# Patient Record
Sex: Female | Born: 1940 | Race: Black or African American | Hispanic: No | Marital: Married | Smoking: Former smoker
Health system: Southern US, Community
[De-identification: ages and names within clinical notes are randomized; demographics above are authoritative.]

## PROBLEM LIST (undated history)

## (undated) DIAGNOSIS — K635 Polyp of colon: Secondary | ICD-10-CM

## (undated) DIAGNOSIS — I2510 Atherosclerotic heart disease of native coronary artery without angina pectoris: Secondary | ICD-10-CM

## (undated) DIAGNOSIS — I219 Acute myocardial infarction, unspecified: Secondary | ICD-10-CM

## (undated) DIAGNOSIS — K219 Gastro-esophageal reflux disease without esophagitis: Secondary | ICD-10-CM

## (undated) DIAGNOSIS — Z9981 Dependence on supplemental oxygen: Secondary | ICD-10-CM

## (undated) DIAGNOSIS — I739 Peripheral vascular disease, unspecified: Secondary | ICD-10-CM

## (undated) DIAGNOSIS — I509 Heart failure, unspecified: Secondary | ICD-10-CM

## (undated) DIAGNOSIS — I10 Essential (primary) hypertension: Secondary | ICD-10-CM

## (undated) DIAGNOSIS — C3490 Malignant neoplasm of unspecified part of unspecified bronchus or lung: Secondary | ICD-10-CM

## (undated) DIAGNOSIS — J449 Chronic obstructive pulmonary disease, unspecified: Secondary | ICD-10-CM

## (undated) HISTORY — PX: OTHER SURGICAL HISTORY: SHX169

## (undated) HISTORY — PX: LUNG LOBECTOMY: SHX167

## (undated) HISTORY — PX: TUBAL LIGATION: SHX77

## (undated) HISTORY — PX: CARDIAC CATHETERIZATION: SHX172

## (undated) HISTORY — PX: CHOLECYSTECTOMY: SHX55

## (undated) HISTORY — PX: TONSILLECTOMY: SUR1361

## (undated) HISTORY — PX: LOBECTOMY: SHX5089

---

## 2013-04-03 ENCOUNTER — Inpatient Hospital Stay (HOSPITAL_COMMUNITY)
Admission: EM | Admit: 2013-04-03 | Discharge: 2013-04-07 | DRG: 192 | Disposition: A | Discharge status: Home / Self Care | Payer: MEDICARE | Attending: Family Medicine | Admitting: Family Medicine

## 2013-04-03 ENCOUNTER — Emergency Department (HOSPITAL_COMMUNITY): Payer: MEDICARE

## 2013-04-03 ENCOUNTER — Encounter (HOSPITAL_COMMUNITY): Payer: Self-pay | Admitting: Emergency Medicine

## 2013-04-03 DIAGNOSIS — Z87891 Personal history of nicotine dependence: Secondary | ICD-10-CM

## 2013-04-03 DIAGNOSIS — D509 Iron deficiency anemia, unspecified: Secondary | ICD-10-CM | POA: Diagnosis present

## 2013-04-03 DIAGNOSIS — I25810 Atherosclerosis of coronary artery bypass graft(s) without angina pectoris: Secondary | ICD-10-CM | POA: Diagnosis present

## 2013-04-03 DIAGNOSIS — I498 Other specified cardiac arrhythmias: Secondary | ICD-10-CM | POA: Diagnosis present

## 2013-04-03 DIAGNOSIS — C3490 Malignant neoplasm of unspecified part of unspecified bronchus or lung: Secondary | ICD-10-CM

## 2013-04-03 DIAGNOSIS — J441 Chronic obstructive pulmonary disease with (acute) exacerbation: Secondary | ICD-10-CM | POA: Diagnosis present

## 2013-04-03 DIAGNOSIS — Z9861 Coronary angioplasty status: Secondary | ICD-10-CM

## 2013-04-03 DIAGNOSIS — I10 Essential (primary) hypertension: Secondary | ICD-10-CM | POA: Diagnosis present

## 2013-04-03 DIAGNOSIS — Z9981 Dependence on supplemental oxygen: Secondary | ICD-10-CM

## 2013-04-03 DIAGNOSIS — I509 Heart failure, unspecified: Secondary | ICD-10-CM | POA: Diagnosis present

## 2013-04-03 DIAGNOSIS — Z85118 Personal history of other malignant neoplasm of bronchus and lung: Secondary | ICD-10-CM

## 2013-04-03 DIAGNOSIS — R079 Chest pain, unspecified: Secondary | ICD-10-CM

## 2013-04-03 DIAGNOSIS — Z8673 Personal history of transient ischemic attack (TIA), and cerebral infarction without residual deficits: Secondary | ICD-10-CM

## 2013-04-03 HISTORY — DX: Malignant neoplasm of unspecified part of unspecified bronchus or lung: C34.90

## 2013-04-03 HISTORY — DX: Atherosclerotic heart disease of native coronary artery without angina pectoris: I25.10

## 2013-04-03 HISTORY — DX: Essential (primary) hypertension: I10

## 2013-04-03 HISTORY — DX: Heart failure, unspecified: I50.9

## 2013-04-03 HISTORY — DX: Gastro-esophageal reflux disease without esophagitis: K21.9

## 2013-04-03 HISTORY — DX: Chronic obstructive pulmonary disease, unspecified: J44.9

## 2013-04-03 LAB — COMPREHENSIVE METABOLIC PANEL
ALK PHOS: 81 U/L (ref 39–117)
ALT: 10 U/L (ref 0–35)
AST: 20 U/L (ref 0–37)
Albumin: 4.1 g/dL (ref 3.5–5.2)
BUN: 8 mg/dL (ref 6–23)
CHLORIDE: 98 meq/L (ref 96–112)
CO2: 31 mEq/L (ref 19–32)
Calcium: 9.2 mg/dL (ref 8.4–10.5)
Creatinine, Ser: 0.55 mg/dL (ref 0.50–1.10)
GFR calc Af Amer: >90 mL/min (ref >90)
GFR calc non Af Amer: >90 mL/min (ref >90)
GLUCOSE: 157 mg/dL — AB (ref 70–99)
POTASSIUM: 3.5 meq/L — AB (ref 3.7–5.3)
SODIUM: 139 meq/L (ref 137–147)
TOTAL PROTEIN: 7.4 g/dL (ref 6.0–8.3)
Total Bilirubin: 0.6 mg/dL (ref 0.3–1.2)

## 2013-04-03 LAB — CBC WITH DIFFERENTIAL/PLATELET
Basophils Absolute: 0 10*3/uL (ref 0.0–0.1)
Basophils Relative: 1 % (ref 0–1)
Eosinophils Absolute: 0.3 10*3/uL (ref 0.0–0.7)
Eosinophils Relative: 5 % (ref 0–5)
HCT: 27.6 % — ABNORMAL LOW (ref 36.0–46.0)
Hemoglobin: 8.6 g/dL — ABNORMAL LOW (ref 12.0–15.0)
LYMPHS ABS: 1.9 10*3/uL (ref 0.7–4.0)
LYMPHS PCT: 35 % (ref 12–46)
MCH: 25 pg — AB (ref 26.0–34.0)
MCHC: 31.2 g/dL (ref 30.0–36.0)
MCV: 80.2 fL (ref 78.0–100.0)
Monocytes Absolute: 0.5 10*3/uL (ref 0.1–1.0)
Monocytes Relative: 10 % (ref 3–12)
NEUTROS ABS: 2.7 10*3/uL (ref 1.7–7.7)
NEUTROS PCT: 50 % (ref 43–77)
PLATELETS: 354 10*3/uL (ref 150–400)
RBC: 3.44 MIL/uL — AB (ref 3.87–5.11)
RDW: 16.5 % — ABNORMAL HIGH (ref 11.5–15.5)
WBC: 5.5 10*3/uL (ref 4.0–10.5)

## 2013-04-03 LAB — URINALYSIS, ROUTINE W REFLEX MICROSCOPIC
Bilirubin Urine: NEGATIVE
GLUCOSE, UA: NEGATIVE mg/dL
Ketones, ur: NEGATIVE mg/dL
Leukocytes, UA: NEGATIVE
Nitrite: NEGATIVE
Protein, ur: NEGATIVE mg/dL
Specific Gravity, Urine: <1.005 — ABNORMAL LOW (ref 1.005–1.030)
Urobilinogen, UA: 0.2 mg/dL (ref 0.0–1.0)
pH: 6.5 (ref 5.0–8.0)

## 2013-04-03 LAB — PRO B NATRIURETIC PEPTIDE: Pro B Natriuretic peptide (BNP): 29.9 pg/mL (ref 0–125)

## 2013-04-03 LAB — URINE MICROSCOPIC-ADD ON

## 2013-04-03 LAB — TROPONIN I: Troponin I: <0.3 ng/mL (ref <0.30)

## 2013-04-03 MED ORDER — MORPHINE SULFATE 2 MG/ML IJ SOLN: 2.0000 mg | INTRAMUSCULAR | Status: DC | PRN

## 2013-04-03 MED ORDER — ALBUTEROL SULFATE (2.5 MG/3ML) 0.083% IN NEBU: 5.0000 mg | INHALATION_SOLUTION | Freq: Once | RESPIRATORY_TRACT | Status: DC

## 2013-04-03 MED ORDER — ALBUTEROL SULFATE (2.5 MG/3ML) 0.083% IN NEBU: 2.5000 mg | INHALATION_SOLUTION | RESPIRATORY_TRACT | Status: DC | PRN

## 2013-04-03 MED ORDER — ENOXAPARIN SODIUM 40 MG/0.4ML ~~LOC~~ SOLN
40.0000 mg | SUBCUTANEOUS | Status: DC
  Administered 2013-04-04 – 2013-04-06 (×3): 40 mg via SUBCUTANEOUS
  Filled 2013-04-03 (×3): qty 0.4

## 2013-04-03 MED ORDER — SODIUM CHLORIDE 0.9 % IV SOLN: 250.0000 mL | INTRAVENOUS | Status: DC | PRN

## 2013-04-03 MED ORDER — DM-GUAIFENESIN ER 30-600 MG PO TB12
1.0000 | ORAL_TABLET | Freq: Two times a day (BID) | ORAL | Status: DC
  Administered 2013-04-04 – 2013-04-07 (×8): 1 via ORAL
  Filled 2013-04-03 (×8): qty 1

## 2013-04-03 MED ORDER — METHYLPREDNISOLONE SODIUM SUCC 125 MG IJ SOLR
60.0000 mg | Freq: Four times a day (QID) | INTRAMUSCULAR | Status: DC
  Administered 2013-04-04 (×2): 60 mg via INTRAVENOUS
  Filled 2013-04-03 (×2): qty 2

## 2013-04-03 MED ORDER — ONDANSETRON HCL 4 MG/2ML IJ SOLN: 4.0000 mg | Freq: Four times a day (QID) | INTRAMUSCULAR | Status: DC | PRN

## 2013-04-03 MED ORDER — ATORVASTATIN CALCIUM 20 MG PO TABS
20.0000 mg | ORAL_TABLET | Freq: Every day | ORAL | Status: DC
  Administered 2013-04-04 – 2013-04-06 (×3): 20 mg via ORAL
  Filled 2013-04-03 (×3): qty 1

## 2013-04-03 MED ORDER — AMLODIPINE BESYLATE 5 MG PO TABS
5.0000 mg | ORAL_TABLET | Freq: Every day | ORAL | Status: DC
  Administered 2013-04-04 – 2013-04-07 (×4): 5 mg via ORAL
  Filled 2013-04-03 (×4): qty 1

## 2013-04-03 MED ORDER — IPRATROPIUM-ALBUTEROL 0.5-2.5 (3) MG/3ML IN SOLN
3.0000 mL | RESPIRATORY_TRACT | Status: DC
  Administered 2013-04-04 (×5): 3 mL via RESPIRATORY_TRACT
  Filled 2013-04-03 (×6): qty 3

## 2013-04-03 MED ORDER — ONDANSETRON HCL 4 MG PO TABS: 4.0000 mg | ORAL_TABLET | Freq: Four times a day (QID) | ORAL | Status: DC | PRN

## 2013-04-03 MED ORDER — ASPIRIN 81 MG PO CHEW
324.0000 mg | CHEWABLE_TABLET | Freq: Once | ORAL | Status: AC
  Administered 2013-04-03: 324 mg via ORAL
  Filled 2013-04-03: qty 4

## 2013-04-03 MED ORDER — BUDESONIDE-FORMOTEROL FUMARATE 160-4.5 MCG/ACT IN AERO
2.0000 | INHALATION_SPRAY | Freq: Two times a day (BID) | RESPIRATORY_TRACT | Status: DC
  Administered 2013-04-04 – 2013-04-07 (×8): 2 via RESPIRATORY_TRACT
  Filled 2013-04-03: qty 6

## 2013-04-03 MED ORDER — SODIUM CHLORIDE 0.9 % IJ SOLN: 3.0000 mL | INTRAMUSCULAR | Status: DC | PRN

## 2013-04-03 MED ORDER — ALBUTEROL SULFATE (2.5 MG/3ML) 0.083% IN NEBU
5.0000 mg | INHALATION_SOLUTION | Freq: Once | RESPIRATORY_TRACT | Status: AC
  Administered 2013-04-03: 5 mg via RESPIRATORY_TRACT
  Filled 2013-04-03: qty 6

## 2013-04-03 MED ORDER — IPRATROPIUM BROMIDE 0.02 % IN SOLN: 0.5000 mg | Freq: Once | RESPIRATORY_TRACT | Status: DC

## 2013-04-03 MED ORDER — IPRATROPIUM BROMIDE 0.02 % IN SOLN
0.5000 mg | Freq: Once | RESPIRATORY_TRACT | Status: AC
  Administered 2013-04-03: 0.5 mg via RESPIRATORY_TRACT
  Filled 2013-04-03: qty 2.5

## 2013-04-03 MED ORDER — SODIUM CHLORIDE 0.9 % IV SOLN: INTRAVENOUS | Status: DC

## 2013-04-03 MED ORDER — PREDNISONE 50 MG PO TABS
60.0000 mg | ORAL_TABLET | Freq: Once | ORAL | Status: AC
  Administered 2013-04-03: 60 mg via ORAL
  Filled 2013-04-03 (×2): qty 1

## 2013-04-03 MED ORDER — DIPHENHYDRAMINE HCL 25 MG PO CAPS
25.0000 mg | ORAL_CAPSULE | Freq: Once | ORAL | Status: AC
  Administered 2013-04-04: 25 mg via ORAL
  Filled 2013-04-03: qty 1

## 2013-04-03 MED ORDER — SODIUM CHLORIDE 0.9 % IJ SOLN
3.0000 mL | Freq: Two times a day (BID) | INTRAMUSCULAR | Status: DC
  Administered 2013-04-04 – 2013-04-06 (×7): 3 mL via INTRAVENOUS

## 2013-04-03 MED ORDER — ASPIRIN EC 81 MG PO TBEC
81.0000 mg | DELAYED_RELEASE_TABLET | Freq: Every day | ORAL | Status: DC
  Administered 2013-04-04 – 2013-04-07 (×4): 81 mg via ORAL
  Filled 2013-04-03 (×4): qty 1

## 2013-04-03 NOTE — ED Notes (Signed)
Pt reporting SOB and cough since last night.  Also reporting some "chest discomfort".  Denies nausea or vomiting.  No distress noted.

## 2013-04-03 NOTE — H&P (Signed)
PCP:   Normand Sloop, MD   Chief Complaint:  Shortness of breath  HPI: 73 year old female who   has a past medical history of CHF (congestive heart failure); COPD (chronic obstructive pulmonary disease); Coronary artery disease; Acid reflux; Hypertension; and Lung cancer. Today presented to the ED with chief complaint of shortness of breath which started yesterday. Patient has a history of COPD, lung cancer status post left upper lobe pneumonectomy, she also has been coughing up yellow-colored phlegm. Patient also complained of chest pain which started at home was 7/10 in intensity. Did not radiate, she has a history of CAD status post coronary stents in CHF. In the ED f first set  of cardiac enzymes are negative, chest x-ray showed underlying emphysema   Allergies:   Allergies  Allergen Reactions  . Ambien [Zolpidem]   . Penicillins       Past Medical History  Diagnosis Date  . CHF (congestive heart failure)   . COPD (chronic obstructive pulmonary disease)   . Coronary artery disease   . Acid reflux   . Hypertension   . Lung cancer     Past Surgical History  Procedure Laterality Date  . Tubal ligation    .  stent    . Cholecystectomy    . Tonsillectomy    . Lobectomy      Prior to Admission medications   Medication Sig Start Date End Date Taking? Authorizing Provider  albuterol (PROVENTIL) (2.5 MG/3ML) 0.083% nebulizer solution Take 2.5 mg by nebulization every 6 (six) hours as needed for wheezing or shortness of breath.   Yes Historical Provider, MD  amLODipine (NORVASC) 5 MG tablet Take 5 mg by mouth daily.   Yes Historical Provider, MD  aspirin EC 81 MG tablet Take 81 mg by mouth daily.   Yes Historical Provider, MD  budesonide-formoterol (SYMBICORT) 160-4.5 MCG/ACT inhaler Inhale 2 puffs into the lungs 2 (two) times daily.   Yes Historical Provider, MD  furosemide (LASIX) 20 MG tablet Take 20 mg by mouth as needed for fluid or edema.    Yes Historical Provider, MD   nitroGLYCERIN (NITROSTAT) 0.4 MG SL tablet Place 0.4 mg under the tongue every 5 (five) minutes as needed for chest pain.   Yes Historical Provider, MD  omeprazole (PRILOSEC OTC) 20 MG tablet Take 20 mg by mouth daily.   Yes Historical Provider, MD  rosuvastatin (CRESTOR) 10 MG tablet Take 10 mg by mouth daily.   Yes Historical Provider, MD    Social History:  reports that she has quit smoking. She does not have any smokeless tobacco history on file. She reports that she does not drink alcohol or use illicit drugs.  History reviewed. No pertinent family history.   All the positives are listed in BOLD  Review of Systems:  HEENT: Headache, blurred vision, runny nose, sore throat Neck: Hypothyroidism, hyperthyroidism,,lymphadenopathy Chest : Shortness of breath, history of COPD, Asthma Heart : Chest pain, history of coronary arterey disease GI:  Nausea, vomiting, diarrhea, constipation, GERD GU: Dysuria, urgency, frequency of urination, hematuria Neuro: Stroke, seizures, syncope Psych: Depression, anxiety, hallucinations   Physical Exam: Blood pressure 127/59, pulse 108, resp. rate 29, SpO2 99.00%. Constitutional:   Patient is a well-developed and well-nourished *female in no acute distress and cooperative with exam. Head: Normocephalic and atraumatic Mouth: Mucus membranes moist Eyes: PERRL, EOMI, conjunctivae normal Neck: Supple, No Thyromegaly Cardiovascular: RRR, S1 normal, S2 normal Pulmonary/Chest: CTAB, no wheezes, rales, or rhonchi Abdominal: Soft. Non-tender, non-distended, bowel sounds  are normal, no masses, organomegaly, or guarding present.  Neurological: A&O x3, Strenght is normal and symmetric bilaterally, cranial nerve II-XII are grossly intact, no focal motor deficit, sensory intact to light touch bilaterally.  Extremities : No Cyanosis, Clubbing or Edema   Labs on Admission:  Results for orders placed during the hospital encounter of 04/03/13 (from the past 48  hour(s))  CBC WITH DIFFERENTIAL     Status: Abnormal   Collection Time    04/03/13  8:23 PM      Result Value Ref Range   WBC 5.5  4.0 - 10.5 K/uL   RBC 3.44 (*) 3.87 - 5.11 MIL/uL   Hemoglobin 8.6 (*) 12.0 - 15.0 g/dL   HCT 27.6 (*) 36.0 - 46.0 %   MCV 80.2  78.0 - 100.0 fL   MCH 25.0 (*) 26.0 - 34.0 pg   MCHC 31.2  30.0 - 36.0 g/dL   RDW 16.5 (*) 11.5 - 15.5 %   Platelets 354  150 - 400 K/uL   Neutrophils Relative % 50  43 - 77 %   Neutro Abs 2.7  1.7 - 7.7 K/uL   Lymphocytes Relative 35  12 - 46 %   Lymphs Abs 1.9  0.7 - 4.0 K/uL   Monocytes Relative 10  3 - 12 %   Monocytes Absolute 0.5  0.1 - 1.0 K/uL   Eosinophils Relative 5  0 - 5 %   Eosinophils Absolute 0.3  0.0 - 0.7 K/uL   Basophils Relative 1  0 - 1 %   Basophils Absolute 0.0  0.0 - 0.1 K/uL  COMPREHENSIVE METABOLIC PANEL     Status: Abnormal   Collection Time    04/03/13  8:23 PM      Result Value Ref Range   Sodium 139  137 - 147 mEq/L   Potassium 3.5 (*) 3.7 - 5.3 mEq/L   Chloride 98  96 - 112 mEq/L   CO2 31  19 - 32 mEq/L   Glucose, Bld 157 (*) 70 - 99 mg/dL   BUN 8  6 - 23 mg/dL   Creatinine, Ser 0.55  0.50 - 1.10 mg/dL   Calcium 9.2  8.4 - 10.5 mg/dL   Total Protein 7.4  6.0 - 8.3 g/dL   Albumin 4.1  3.5 - 5.2 g/dL   AST 20  0 - 37 U/L   ALT 10  0 - 35 U/L   Alkaline Phosphatase 81  39 - 117 U/L   Total Bilirubin 0.6  0.3 - 1.2 mg/dL   GFR calc non Af Amer >90  >90 mL/min   GFR calc Af Amer >90  >90 mL/min   Comment: (NOTE)     The eGFR has been calculated using the CKD EPI equation.     This calculation has not been validated in all clinical situations.     eGFR's persistently <90 mL/min signify possible Chronic Kidney     Disease.  TROPONIN I     Status: None   Collection Time    04/03/13  8:23 PM      Result Value Ref Range   Troponin I <0.30  <0.30 ng/mL   Comment:            Due to the release kinetics of cTnI,     a negative result within the first hours     of the onset of symptoms  does not rule out     myocardial infarction with certainty.  If myocardial infarction is still suspected,     repeat the test at appropriate intervals.  PRO B NATRIURETIC PEPTIDE     Status: None   Collection Time    04/03/13  8:23 PM      Result Value Ref Range   Pro B Natriuretic peptide (BNP) 29.9  0 - 125 pg/mL  URINALYSIS, ROUTINE W REFLEX MICROSCOPIC     Status: Abnormal   Collection Time    04/03/13  9:50 PM      Result Value Ref Range   Color, Urine YELLOW  YELLOW   APPearance CLEAR  CLEAR   Specific Gravity, Urine <1.005 (*) 1.005 - 1.030   pH 6.5  5.0 - 8.0   Glucose, UA NEGATIVE  NEGATIVE mg/dL   Hgb urine dipstick TRACE (*) NEGATIVE   Bilirubin Urine NEGATIVE  NEGATIVE   Ketones, ur NEGATIVE  NEGATIVE mg/dL   Protein, ur NEGATIVE  NEGATIVE mg/dL   Urobilinogen, UA 0.2  0.0 - 1.0 mg/dL   Nitrite NEGATIVE  NEGATIVE   Leukocytes, UA NEGATIVE  NEGATIVE  URINE MICROSCOPIC-ADD ON     Status: None   Collection Time    04/03/13  9:50 PM      Result Value Ref Range   RBC / HPF 0-2  <3 RBC/hpf    Radiological Exams on Admission: Dg Chest 2 View  04/03/2013   CLINICAL DATA:  Shortness of breath and cough  EXAM: CHEST  2 VIEW  COMPARISON:  None.  FINDINGS: There is underlying emphysematous change. There is an apparent nipple shadow on the left. There is postoperative change in the left upper lobe region elsewhere lungs are clear. Heart size is normal. Pulmonary vascularity reflects underlying emphysema. No adenopathy. There are stents in the left circumflex and right coronary artery regions. There are no bone lesions.  IMPRESSION: Underlying emphysema. Probable nipple shadow on the left; advise repeat study with nipple markers to confirm. No edema or consolidation. Coronary artery stents are noted.   Electronically Signed   By: Lowella Grip M.D.   On: 04/03/2013 20:43    Assessment/Plan Principal Problem:   COPD exacerbation Active Problems:   COPD with acute  exacerbation   Chest pain   CAD (coronary artery disease) of artery bypass graft   Lung cancer  COPD exacerbation We'll start Solu Medrol 60 mg IV every 6 hours, DuoNeb nebulizers every 6 hours, albuterol every 2 hours when necessary. We will start Mucinex DM 1 tablet by mouth twice a day.  Chest pain Patient has history of CAD, will cycle cardiac enzymes. Obtain EKG.  CAD Continue aspirin, Crestor 10 mg by mouth daily. We'll also obtain 2-D echocardiogram in a.m. as patient has a history of CHF and has been on Lasix when necessary  DVT prophylaxis Lovenox  Code status: Patient is partial code, only CPR no intubation and mechanical ventilation  Family discussion: Discussed with patient's daughter at bedside   Time Spent on Admission: 7 minutes  LAMA,GAGAN S [facility] Pager: [phone] 04/03/2013, 11:24 PM  If 7PM-7AM, please contact night-coverage  [email or web address]  Password [email or web address]

## 2013-04-03 NOTE — ED Provider Notes (Signed)
CSN: [id number]     Arrival date & time 04/03/13  1908 History   First MD Initiated Contact with Patient 04/03/13 1924     Chief Complaint  Patient presents with  . Shortness of Breath     (Consider location/radiation/quality/duration/timing/severity/associated sxs/prior Treatment) HPI Comments: Patient with complex medical history including CHF, COPD on home oxygen, CAD status post stenting x5, lung cancer status post resection presenting with shortness of breath, cough and chest tightness and upper back tightness ongoing since last night. Her daughter states the patient has been sick for "years" but worse over the past week. No fevers. Chest is tight without any significant pain. No nausea, vomiting or fever. By mouth intake and urine output. No increase in oxygen requirement. All of her care is in [place] and her pulmonologist is at [facility]. No sick contacts or recent travel.   The history is provided by the patient and a relative.    Past Medical History  Diagnosis Date  . CHF (congestive heart failure)   . COPD (chronic obstructive pulmonary disease)   . Coronary artery disease   . Acid reflux   . Hypertension   . Lung cancer    Past Surgical History  Procedure Laterality Date  . Tubal ligation    .  stent    . Cholecystectomy    . Tonsillectomy    . Lobectomy     History reviewed. No pertinent family history. History  Substance Use Topics  . Smoking status: Former Research scientist (life sciences)  . Smokeless tobacco: Not on file  . Alcohol Use: No   OB History   Grav Para Term Preterm Abortions TAB SAB Ect Mult Living                 Review of Systems  Constitutional: Positive for activity change and appetite change. Negative for fever and fatigue.  HENT: Positive for congestion. Negative for rhinorrhea.   Respiratory: Positive for cough, chest tightness and shortness of breath.   Cardiovascular: Negative for chest pain.  Gastrointestinal: Negative for nausea, vomiting and abdominal  pain.  Genitourinary: Negative for dysuria, hematuria, vaginal bleeding and vaginal discharge.  Musculoskeletal: Negative for arthralgias and myalgias.  Skin: Negative for rash.  Neurological: Negative for dizziness, weakness and headaches.  A complete 10 system review of systems was obtained and all systems are negative except as noted in the HPI and PMH.      Allergies  Ambien and Penicillins  Home Medications   No current outpatient prescriptions on file. BP 127/59  Pulse 108  Resp 29  Ht 6\' 1"  (1.854 m)  Wt 218 lb 14.7 oz (99.3 kg)  BMI 28.89 kg/m2  SpO2 96% Physical Exam  Constitutional: She is oriented to person, place, and time. She appears well-developed and well-nourished. No distress.  HENT:  Head: Normocephalic and atraumatic.  Mouth/Throat: Oropharynx is clear and moist. No oropharyngeal exudate.  Eyes: Conjunctivae and EOM are normal. Pupils are equal, round, and reactive to light.  Neck: Normal range of motion. Neck supple.  Cardiovascular: Normal rate, regular rhythm and normal heart sounds.   Pulmonary/Chest: Effort normal. She has wheezes.  Mildly increased work of breathing with expiratory wheezing throughout  Abdominal: Soft. There is no tenderness. There is no rebound and no guarding.  Musculoskeletal: Normal range of motion. She exhibits no edema and no tenderness.  Neurological: She is alert and oriented to person, place, and time. No cranial nerve deficit. She exhibits normal muscle tone. Coordination normal.  Skin: Skin  is warm.    ED Course  Procedures (including critical care time) Labs Review Labs Reviewed  CBC WITH DIFFERENTIAL - Abnormal; Notable for the following:    RBC 3.44 (*)    Hemoglobin 8.6 (*)    HCT 27.6 (*)    MCH 25.0 (*)    RDW 16.5 (*)    All other components within normal limits  COMPREHENSIVE METABOLIC PANEL - Abnormal; Notable for the following:    Potassium 3.5 (*)    Glucose, Bld 157 (*)    All other components  within normal limits  URINALYSIS, ROUTINE W REFLEX MICROSCOPIC - Abnormal; Notable for the following:    Specific Gravity, Urine <1.005 (*)    Hgb urine dipstick TRACE (*)    All other components within normal limits  TROPONIN I  PRO B NATRIURETIC PEPTIDE  URINE MICROSCOPIC-ADD ON  CBC  COMPREHENSIVE METABOLIC PANEL  TROPONIN I  TROPONIN I  TROPONIN I  POC OCCULT BLOOD, ED   Imaging Review Dg Chest 2 View  04/03/2013   CLINICAL DATA:  Shortness of breath and cough  EXAM: CHEST  2 VIEW  COMPARISON:  None.  FINDINGS: There is underlying emphysematous change. There is an apparent nipple shadow on the left. There is postoperative change in the left upper lobe region elsewhere lungs are clear. Heart size is normal. Pulmonary vascularity reflects underlying emphysema. No adenopathy. There are stents in the left circumflex and right coronary artery regions. There are no bone lesions.  IMPRESSION: Underlying emphysema. Probable nipple shadow on the left; advise repeat study with nipple markers to confirm. No edema or consolidation. Coronary artery stents are noted.   Electronically Signed   By: Lowella Grip M.D.   On: 04/03/2013 20:43     EKG Interpretation None      MDM   Final diagnoses:  COPD exacerbation    Cough, shortness of breath, chest tightness since last night. History of COPD, CVA and lung cancer. No increase in oxygen requirement. Wheezing on exam without distress or hypoxia.  Nebs, steroids, CXray, EKG  EKG not crossing over.  Sinus tachycardia, rate 103, septal Q waves, normal intervals, nonspecific ST changes, no comparison. Hemoglobin 8.6. No comparison.  Family states she has needed blood transfusions in past.  Wheezing and tachycardia persist. Additional nebs given. Troponin negative. Will admit for presumed COPD exacerbation. D/w Dr. Darrick Meigs.    Ezequiel Essex, MD 04/04/13 [phone]

## 2013-04-04 DIAGNOSIS — I379 Nonrheumatic pulmonary valve disorder, unspecified: Secondary | ICD-10-CM

## 2013-04-04 LAB — CBC
HCT: 26.4 % — ABNORMAL LOW (ref 36.0–46.0)
HEMOGLOBIN: 8.1 g/dL — AB (ref 12.0–15.0)
MCH: 24.8 pg — AB (ref 26.0–34.0)
MCHC: 30.7 g/dL (ref 30.0–36.0)
MCV: 81 fL (ref 78.0–100.0)
PLATELETS: 321 10*3/uL (ref 150–400)
RBC: 3.26 MIL/uL — AB (ref 3.87–5.11)
RDW: 16.4 % — ABNORMAL HIGH (ref 11.5–15.5)
WBC: 2.8 10*3/uL — AB (ref 4.0–10.5)

## 2013-04-04 LAB — COMPREHENSIVE METABOLIC PANEL
ALT: 10 U/L (ref 0–35)
AST: 20 U/L (ref 0–37)
Albumin: 3.6 g/dL (ref 3.5–5.2)
Alkaline Phosphatase: 74 U/L (ref 39–117)
BILIRUBIN TOTAL: 0.6 mg/dL (ref 0.3–1.2)
BUN: 7 mg/dL (ref 6–23)
CALCIUM: 9 mg/dL (ref 8.4–10.5)
CHLORIDE: 101 meq/L (ref 96–112)
CO2: 28 meq/L (ref 19–32)
Creatinine, Ser: 0.47 mg/dL — ABNORMAL LOW (ref 0.50–1.10)
GLUCOSE: 322 mg/dL — AB (ref 70–99)
Potassium: 4 mEq/L (ref 3.7–5.3)
Sodium: 143 mEq/L (ref 137–147)
Total Protein: 6.7 g/dL (ref 6.0–8.3)

## 2013-04-04 LAB — IRON AND TIBC
Iron: 23 ug/dL — ABNORMAL LOW (ref 42–135)
Saturation Ratios: 5 % — ABNORMAL LOW (ref 20–55)
TIBC: 494 ug/dL — ABNORMAL HIGH (ref 250–470)
UIBC: 471 ug/dL — AB (ref 125–400)

## 2013-04-04 LAB — TROPONIN I
Troponin I: <0.3 ng/mL (ref <0.30)
Troponin I: <0.3 ng/mL (ref <0.30)

## 2013-04-04 LAB — FERRITIN: Ferritin: 7 ng/mL — ABNORMAL LOW (ref 10–291)

## 2013-04-04 MED ORDER — ACETAMINOPHEN 325 MG PO TABS
650.0000 mg | ORAL_TABLET | Freq: Four times a day (QID) | ORAL | Status: DC | PRN
  Administered 2013-04-04 – 2013-04-06 (×4): 650 mg via ORAL
  Filled 2013-04-04 (×4): qty 2

## 2013-04-04 MED ORDER — MAGNESIUM HYDROXIDE 400 MG/5ML PO SUSP
15.0000 mL | Freq: Every day | ORAL | Status: DC | PRN
  Administered 2013-04-04 – 2013-04-05 (×2): 15 mL via ORAL
  Filled 2013-04-04 (×2): qty 30

## 2013-04-04 MED ORDER — LEVALBUTEROL HCL 0.63 MG/3ML IN NEBU
0.6300 mg | INHALATION_SOLUTION | Freq: Four times a day (QID) | RESPIRATORY_TRACT | Status: DC
  Administered 2013-04-05 – 2013-04-07 (×9): 0.63 mg via RESPIRATORY_TRACT
  Filled 2013-04-04 (×9): qty 3

## 2013-04-04 MED ORDER — IPRATROPIUM BROMIDE 0.02 % IN SOLN
0.5000 mg | Freq: Four times a day (QID) | RESPIRATORY_TRACT | Status: DC
  Administered 2013-04-05 – 2013-04-07 (×9): 0.5 mg via RESPIRATORY_TRACT
  Filled 2013-04-04 (×9): qty 2.5

## 2013-04-04 MED ORDER — LEVALBUTEROL HCL 0.63 MG/3ML IN NEBU
0.6300 mg | INHALATION_SOLUTION | RESPIRATORY_TRACT | Status: DC
  Administered 2013-04-04: 0.63 mg via RESPIRATORY_TRACT
  Filled 2013-04-04: qty 3

## 2013-04-04 MED ORDER — IPRATROPIUM BROMIDE 0.02 % IN SOLN
0.5000 mg | RESPIRATORY_TRACT | Status: DC
  Administered 2013-04-04: 0.5 mg via RESPIRATORY_TRACT
  Filled 2013-04-04: qty 2.5

## 2013-04-04 MED ORDER — METHYLPREDNISOLONE SODIUM SUCC 40 MG IJ SOLR
40.0000 mg | Freq: Four times a day (QID) | INTRAMUSCULAR | Status: DC
  Administered 2013-04-04 – 2013-04-06 (×9): 40 mg via INTRAVENOUS
  Filled 2013-04-04 (×9): qty 1

## 2013-04-04 MED ORDER — LEVOFLOXACIN IN D5W 750 MG/150ML IV SOLN
750.0000 mg | INTRAVENOUS | Status: DC
  Administered 2013-04-04 – 2013-04-06 (×3): 750 mg via INTRAVENOUS
  Filled 2013-04-04 (×3): qty 150

## 2013-04-04 NOTE — Progress Notes (Signed)
[facility] PROGRESS NOTE  Rachael Bradley HER:[id number] DOB: 09/22/1940 DOA: 04/03/2013 PCP: Normand Sloop, MD  Assessment/Plan: Principal Problem:  COPD exacerbation  Active Problems:  COPD with acute exacerbation  Chest pain  CAD (coronary artery disease) of artery bypass graft  Lung cancer  73 y/o female with PMH of CAD, HTN, CHF, lung CA s/p L lung surgery, COPD on oxygen admitted with COPD exacerbation    1. COPD exacerbation;  -cont IV steroids, atx, bronchodilators, oxygen  2. CAD; trop negative; EKG. Cont aspirin, statin;  -pend echo;   3. CHF clinically euvolemic; cont home regimen; pend echo   4. Anemia; no s/s of acute bleeding; check iron profile    Code Status: full Family Communication: d/w aptient (indicate person spoken with, relationship, and if by phone, the number) Disposition Plan: home 2-3 days    Consultants:  none  Procedures:  None   Antibiotics:  3/15<<<<   (indicate start date, and stop date if known)  HPI/Subjective: alert  Objective: Filed Vitals:   04/04/13 0414  BP: 117/58  Pulse: 108  Temp: 98.1 F (36.7 C)  Resp: 24   No intake or output data in the 24 hours ending 04/04/13 0854 Filed Weights   04/04/13 0024 04/04/13 0100 04/04/13 0414  Weight: 99.3 kg (218 lb 14.7 oz) 75.1 kg (165 lb 9.1 oz) 75.1 kg (165 lb 9.1 oz)    Exam:   General:  alert  Cardiovascular: s1,s2 rrr  Respiratory: few wheezing   Abdomen: soft,nt, nd   Musculoskeletal: no Edema   Data Reviewed: Basic Metabolic Panel:  Recent Labs Lab 04/03/13 2023 04/04/13 0225  NA 139 143  K 3.5* 4.0  CL 98 101  CO2 31 28  GLUCOSE 157* 322*  BUN 8 7  CREATININE 0.55 0.47*  CALCIUM 9.2 9.0   Liver Function Tests:  Recent Labs Lab 04/03/13 2023 04/04/13 0225  AST 20 20  ALT 10 10  ALKPHOS 81 74  BILITOT 0.6 0.6  PROT 7.4 6.7  ALBUMIN 4.1 3.6   No results found for this basename: LIPASE, AMYLASE,  in the last 168 hours No  results found for this basename: AMMONIA,  in the last 168 hours CBC:  Recent Labs Lab 04/03/13 2023 04/04/13 0225  WBC 5.5 2.8*  NEUTROABS 2.7  --   HGB 8.6* 8.1*  HCT 27.6* 26.4*  MCV 80.2 81.0  PLT 354 321   Cardiac Enzymes:  Recent Labs Lab 04/03/13 2023 04/04/13 0225  TROPONINI <0.30 <0.30   BNP (last 3 results)  Recent Labs  04/03/13 2023  PROBNP 29.9   CBG: No results found for this basename: GLUCAP,  in the last 168 hours  No results found for this or any previous visit (from the past 240 hour(s)).   Studies: Dg Chest 2 View  04/03/2013   CLINICAL DATA:  Shortness of breath and cough  EXAM: CHEST  2 VIEW  COMPARISON:  None.  FINDINGS: There is underlying emphysematous change. There is an apparent nipple shadow on the left. There is postoperative change in the left upper lobe region elsewhere lungs are clear. Heart size is normal. Pulmonary vascularity reflects underlying emphysema. No adenopathy. There are stents in the left circumflex and right coronary artery regions. There are no bone lesions.  IMPRESSION: Underlying emphysema. Probable nipple shadow on the left; advise repeat study with nipple markers to confirm. No edema or consolidation. Coronary artery stents are noted.   Electronically Signed   By: Lowella Grip  M.D.   On: 04/03/2013 20:43    Scheduled Meds: . amLODipine  5 mg Oral Daily  . aspirin EC  81 mg Oral Daily  . atorvastatin  20 mg Oral q1800  . budesonide-formoterol  2 puff Inhalation BID  . dextromethorphan-guaiFENesin  1 tablet Oral BID  . enoxaparin (LOVENOX) injection  40 mg Subcutaneous Q24H  . ipratropium-albuterol  3 mL Nebulization Q4H  . methylPREDNISolone (SOLU-MEDROL) injection  60 mg Intravenous Q6H  . sodium chloride  3 mL Intravenous Q12H   Continuous Infusions:   Principal Problem:   COPD exacerbation Active Problems:   COPD with acute exacerbation   Chest pain   CAD (coronary artery disease) of artery bypass  graft   Lung cancer    Time spent: >35 minutes     Kinnie Feil  [facility] Pager [phone]. If 7PM-7AM, please contact night-coverage at [email or web address], password [facility] 04/04/2013, 8:54 AM  LOS: 1 day

## 2013-04-04 NOTE — Progress Notes (Signed)
Patient c/o constipation.  States she takes Milk of Mag at home.  Dr. Daleen Bo notified.

## 2013-04-04 NOTE — Progress Notes (Signed)
Patient c/o headache.  Only Morphine ordered.  Dr. Daleen Bo notified.

## 2013-04-04 NOTE — Progress Notes (Signed)
  Echocardiogram 2D Echocardiogram has been performed.  Rachael Bradley M 04/04/2013, 10:19 AM

## 2013-04-04 NOTE — Progress Notes (Signed)
Late entry 1835 - Patient heart rate 110-125 on telemetry.  Dr. Daleen Bo notified.  No new orders at this time.

## 2013-04-04 NOTE — Progress Notes (Signed)
Utilization review Completed Chryl Holten RN BSN   

## 2013-04-05 LAB — GLUCOSE, CAPILLARY: GLUCOSE-CAPILLARY: 143 mg/dL — AB (ref 70–99)

## 2013-04-05 LAB — OCCULT BLOOD, POC DEVICE: FECAL OCCULT BLD: NEGATIVE

## 2013-04-05 MED ORDER — PANTOPRAZOLE SODIUM 40 MG PO TBEC
40.0000 mg | DELAYED_RELEASE_TABLET | Freq: Every day | ORAL | Status: DC
  Administered 2013-04-05 – 2013-04-07 (×3): 40 mg via ORAL
  Filled 2013-04-05 (×4): qty 1

## 2013-04-05 NOTE — Progress Notes (Signed)
[facility] PROGRESS NOTE  Rachael Bradley ERX:[id number] DOB: 05/22/1940 DOA: 04/03/2013 PCP: Normand Sloop, MD  Assessment/Plan: Principal Problem:  COPD exacerbation  Active Problems:  COPD with acute exacerbation  Chest pain  CAD (coronary artery disease) of artery bypass graft  Lung cancer  73 y/o female with PMH of CAD, HTN, CHF, lung CA s/p L lung surgery, COPD on oxygen admitted with COPD exacerbation    1. COPD exacerbation;  -improving on IV steroids, atx, bronchodilators, oxygen  2. CAD; trop negative; EKG. Cont aspirin, statin;  -echo: LVEF 60%, there were no regional wall motion abnormalities  3. Chronic CHF; clinically euvolemic; cont home regimen; echo: LVEF 60%  4. Anemia; no s/s of acute bleeding;  -IDA; started IV iron; check hemoccult blood    Code Status: full Family Communication: d/w patient, called updated Deshazor,Rachael Bradley Daughter [phone] (indicate person spoken with, relationship, and if by phone, the number) Disposition Plan: home 2-3 days    Consultants:  none  Procedures:  None   Antibiotics:  3/15<<<<   (indicate start date, and stop date if known)  HPI/Subjective: alert  Objective: Filed Vitals:   04/05/13 0626  BP:   Pulse: 108  Temp: 97.9 F (36.6 C)  Resp: 22    Intake/Output Summary (Last 24 hours) at 04/05/13 0939 Last data filed at 04/04/13 1901  Gross per 24 hour  Intake    630 ml  Output      0 ml  Net    630 ml   Filed Weights   04/04/13 0100 04/04/13 0414 04/05/13 0626  Weight: 75.1 kg (165 lb 9.1 oz) 75.1 kg (165 lb 9.1 oz) 33.657 kg (74 lb 3.2 oz)    Exam:   General:  alert  Cardiovascular: s1,s2 rrr  Respiratory: few wheezing   Abdomen: soft,nt, nd   Musculoskeletal: no Edema   Data Reviewed: Basic Metabolic Panel:  Recent Labs Lab 04/03/13 2023 04/04/13 0225  NA 139 143  K 3.5* 4.0  CL 98 101  CO2 31 28  GLUCOSE 157* 322*  BUN 8 7  CREATININE 0.55 0.47*  CALCIUM 9.2  9.0   Liver Function Tests:  Recent Labs Lab 04/03/13 2023 04/04/13 0225  AST 20 20  ALT 10 10  ALKPHOS 81 74  BILITOT 0.6 0.6  PROT 7.4 6.7  ALBUMIN 4.1 3.6   No results found for this basename: LIPASE, AMYLASE,  in the last 168 hours No results found for this basename: AMMONIA,  in the last 168 hours CBC:  Recent Labs Lab 04/03/13 2023 04/04/13 0225  WBC 5.5 2.8*  NEUTROABS 2.7  --   HGB 8.6* 8.1*  HCT 27.6* 26.4*  MCV 80.2 81.0  PLT 354 321   Cardiac Enzymes:  Recent Labs Lab 04/03/13 2023 04/04/13 0225 04/04/13 0802 04/04/13 1425  TROPONINI <0.30 <0.30 <0.30 <0.30   BNP (last 3 results)  Recent Labs  04/03/13 2023  PROBNP 29.9   CBG: No results found for this basename: GLUCAP,  in the last 168 hours  No results found for this or any previous visit (from the past 240 hour(s)).   Studies: Dg Chest 2 View  04/03/2013   CLINICAL DATA:  Shortness of breath and cough  EXAM: CHEST  2 VIEW  COMPARISON:  None.  FINDINGS: There is underlying emphysematous change. There is an apparent nipple shadow on the left. There is postoperative change in the left upper lobe region elsewhere lungs are clear. Heart size is normal. Pulmonary vascularity reflects  underlying emphysema. No adenopathy. There are stents in the left circumflex and right coronary artery regions. There are no bone lesions.  IMPRESSION: Underlying emphysema. Probable nipple shadow on the left; advise repeat study with nipple markers to confirm. No edema or consolidation. Coronary artery stents are noted.   Electronically Signed   By: Lowella Grip M.D.   On: 04/03/2013 20:43    Scheduled Meds: . amLODipine  5 mg Oral Daily  . aspirin EC  81 mg Oral Daily  . atorvastatin  20 mg Oral q1800  . budesonide-formoterol  2 puff Inhalation BID  . dextromethorphan-guaiFENesin  1 tablet Oral BID  . enoxaparin (LOVENOX) injection  40 mg Subcutaneous Q24H  . ipratropium  0.5 mg Nebulization Q6H  .  levalbuterol  0.63 mg Nebulization Q6H  . levofloxacin (LEVAQUIN) IV  750 mg Intravenous Q24H  . methylPREDNISolone (SOLU-MEDROL) injection  40 mg Intravenous Q6H  . sodium chloride  3 mL Intravenous Q12H   Continuous Infusions:   Principal Problem:   COPD exacerbation Active Problems:   COPD with acute exacerbation   Chest pain   CAD (coronary artery disease) of artery bypass graft   Lung cancer    Time spent: >35 minutes     Kinnie Feil  [facility] Pager [phone]. If 7PM-7AM, please contact night-coverage at [email or web address], password [facility] 04/05/2013, 9:39 AM  LOS: 2 days

## 2013-04-05 NOTE — Clinical Documentation Improvement (Signed)
Please clarify resp status. Thank you.  Possible Clinical Conditions?  Acute Respiratory Failure Acute on Chronic Respiratory Failure Chronic Respiratory Failure Other Condition Cannot Clinically Determine   Supporting Information: Risk Factors: History of COPD with Home O2 3/15 progress note: "history of COPD, lung cancer status post left upper lobe pneumonectomy, she also has been coughing up yellow-colored phlegm."   "COPD on oxygen admitted with COPD exacerbation "   Signs & Symptoms: Shortness of breath Cough Resp 22-24  Treatment: Atrovent neb q6h Xopenex neb q6h Continuous O2 Keep sats >92%  Thank You, Estella Husk ,RN Clinical Documentation Specialist:  [place] [employer]

## 2013-04-06 LAB — CBC
HCT: 28.1 % — ABNORMAL LOW (ref 36.0–46.0)
Hemoglobin: 8.6 g/dL — ABNORMAL LOW (ref 12.0–15.0)
MCH: 24.7 pg — ABNORMAL LOW (ref 26.0–34.0)
MCHC: 30.6 g/dL (ref 30.0–36.0)
MCV: 80.7 fL (ref 78.0–100.0)
Platelets: 372 10*3/uL (ref 150–400)
RBC: 3.48 MIL/uL — AB (ref 3.87–5.11)
RDW: 17 % — ABNORMAL HIGH (ref 11.5–15.5)
WBC: 15.5 10*3/uL — ABNORMAL HIGH (ref 4.0–10.5)

## 2013-04-06 LAB — OCCULT BLOOD X 1 CARD TO LAB, STOOL: FECAL OCCULT BLD: NEGATIVE

## 2013-04-06 LAB — TROPONIN I

## 2013-04-06 MED ORDER — METHYLPREDNISOLONE SODIUM SUCC 125 MG IJ SOLR
60.0000 mg | Freq: Two times a day (BID) | INTRAMUSCULAR | Status: DC
  Administered 2013-04-06: 60 mg via INTRAVENOUS
  Filled 2013-04-06: qty 2

## 2013-04-06 MED ORDER — FERROUS GLUCONATE 324 (38 FE) MG PO TABS
324.0000 mg | ORAL_TABLET | Freq: Two times a day (BID) | ORAL | Status: DC
  Administered 2013-04-06: 324 mg via ORAL
  Filled 2013-04-06 (×6): qty 1

## 2013-04-06 MED ORDER — LEVOFLOXACIN 750 MG PO TABS: 750.0000 mg | ORAL_TABLET | ORAL | Status: DC

## 2013-04-06 NOTE — Progress Notes (Signed)
[facility] PROGRESS NOTE  Rachael Bradley BWI:[id number] DOB: 1940/02/15 DOA: 04/03/2013 PCP: Normand Sloop, MD  Brief narrative 73 y/o female with PMH of CAD, HTN, CHF, lung CA s/p L lung surgery, COPD on oxygen admitted with COPD exacerbation.   Assessment/Plan:   COPD exacerbation  -Slowly improving with IV steroids,bronchodilators, oxygen, when necessary Levaquin. -Continue current management. Still wheezy on exam. Discharged Home tomorrow if continues to improve .  Chest pain Appears respiratory in nature with chest tightness and wheezing. 2-D echo with normal EF. Serial troponins negative. Normal EKG   Chronic CHF Euvolemic clinically. Continue aspirin and statin.  Anemia Secondary to iron deficiency. Stool for  occult blood negative. We'll add iron supplements  Hypertension Continue amlodipine  Code Status: full  Family Communication: Discussed with patient Disposition Plan: home tomorrow if continues to improve      Consultants:  None  Procedures:  None  Antibiotics:  Levaquin (3/14>> until 3/18)  HPI/Subjective: Seen and examined this morning. Complained of chest tightness. EKG and troponin done which were unremarkable. Reported shortness of breath to be better after nebulizer treatment received.  Objective: Filed Vitals:   04/06/13 0919  BP: 137/68  Pulse: 108  Temp:   Resp:     Intake/Output Summary (Last 24 hours) at 04/06/13 1238 Last data filed at 04/06/13 6384  Gross per 24 hour  Intake    480 ml  Output      1 ml  Net    479 ml   Filed Weights   04/04/13 0100 04/04/13 0414 04/05/13 0626  Weight: 75.1 kg (165 lb 9.1 oz) 75.1 kg (165 lb 9.1 oz) 33.657 kg (74 lb 3.2 oz)    Exam:   General:  Elderly female lying in bed in no acute distress  HEENT: no pallor, moist oral mucosa  Cardiovascular: Normal S1-S2,, no murmurs, rubs gallop  Respiratory: Scattered wheezes bilaterally, no crackles  Abdomen: soft , Nontender,  nondistended, bowel sounds present  Musculoskeletal: warm, no edema  CNS: AAOX3  Data Reviewed: Basic Metabolic Panel:  Recent Labs Lab 04/03/13 2023 04/04/13 0225  NA 139 143  K 3.5* 4.0  CL 98 101  CO2 31 28  GLUCOSE 157* 322*  BUN 8 7  CREATININE 0.55 0.47*  CALCIUM 9.2 9.0   Liver Function Tests:  Recent Labs Lab 04/03/13 2023 04/04/13 0225  AST 20 20  ALT 10 10  ALKPHOS 81 74  BILITOT 0.6 0.6  PROT 7.4 6.7  ALBUMIN 4.1 3.6   No results found for this basename: LIPASE, AMYLASE,  in the last 168 hours No results found for this basename: AMMONIA,  in the last 168 hours CBC:  Recent Labs Lab 04/03/13 2023 04/04/13 0225 04/06/13 0603  WBC 5.5 2.8* 15.5*  NEUTROABS 2.7  --   --   HGB 8.6* 8.1* 8.6*  HCT 27.6* 26.4* 28.1*  MCV 80.2 81.0 80.7  PLT 354 321 372   Cardiac Enzymes:  Recent Labs Lab 04/03/13 2023 04/04/13 0225 04/04/13 0802 04/04/13 1425 04/06/13 0603  TROPONINI <0.30 <0.30 <0.30 <0.30 <0.30   BNP (last 3 results)  Recent Labs  04/03/13 2023  PROBNP 29.9   CBG:  Recent Labs Lab 04/05/13 1657  GLUCAP 143*    No results found for this or any previous visit (from the past 240 hour(s)).   Studies: No results found.  Scheduled Meds: . amLODipine  5 mg Oral Daily  . aspirin EC  81 mg Oral Daily  . atorvastatin  20 mg Oral q1800  . budesonide-formoterol  2 puff Inhalation BID  . dextromethorphan-guaiFENesin  1 tablet Oral BID  . enoxaparin (LOVENOX) injection  40 mg Subcutaneous Q24H  . ipratropium  0.5 mg Nebulization Q6H  . levalbuterol  0.63 mg Nebulization Q6H  . [START ON 04/08/2013] levofloxacin  750 mg Oral Q48H  . methylPREDNISolone (SOLU-MEDROL) injection  40 mg Intravenous Q6H  . pantoprazole  40 mg Oral Daily  . sodium chloride  3 mL Intravenous Q12H   Continuous Infusions:     Time spent: 25 minutes    Rachael Bradley  [facility] Pager [phone]. If 7PM-7AM, please contact night-coverage at  [email or web address], password [facility] 04/06/2013, 12:38 PM  LOS: 3 days

## 2013-04-06 NOTE — Progress Notes (Signed)
Patient c/o feeling light headed and chest pain with deep breathing. Dr. Clementeen Graham notified. Vital signs stables. New orders put in by physician.

## 2013-04-06 NOTE — Care Management Note (Addendum)
    Page 1 of 1   04/07/2013     11:20:08 AM   CARE MANAGEMENT NOTE 04/07/2013  Patient:  Rachael Bradley, Rachael Bradley   Account Number:  [id number]  Date Initiated:  04/06/2013  Documentation initiated by:  Claretha Cooper  Subjective/Objective Assessment:   Pt admitted from home where she lives with spouse. Has chronic O2 and Neb machine. Pt already has O2 tank in her room for DC. Anticipate DC tomorrow     Action/Plan:   Anticipated DC Date:  04/07/2013   Anticipated DC Plan:  [place]  CM consult      Choice offered to / List presented to:             Status of service:  Completed, signed off Medicare Important Message given?  YES (If response is "NO", the following Medicare IM given date fields will be blank) Date Medicare IM given:  04/07/2013 Date Additional Medicare IM given:    Discharge Disposition:  HOME/SELF CARE  Per UR Regulation:    If discussed at Long Length of Stay Meetings, dates discussed:    Comments:  04/06/13 Claretha Cooper RN BSN CM

## 2013-04-06 NOTE — Progress Notes (Signed)
PHARMACIST - PHYSICIAN COMMUNICATION DR:   [place] CONCERNING: Antibiotic IV to Oral Route Change Policy  RECOMMENDATION: This patient is receiving Levaquin by the intravenous route.  Based on criteria approved by the Pharmacy and Therapeutics Committee, the antibiotic(s) is/are being converted to the equivalent oral dose form(s).   DESCRIPTION: These criteria include:  Patient being treated for a respiratory tract infection, urinary tract infection, cellulitis or clostridium difficile associated diarrhea if on metronidazole  The patient is not neutropenic and does not exhibit a GI malabsorption state  The patient is eating (either orally or via tube) and/or has been taking other orally administered medications for a least 24 hours  The patient is improving clinically and has a Tmax < 100.5  If you have questions about this conversion, please contact the Pharmacy Department  [x]   [phone] )  [facility] []   [phone] )  [facility]  []   [phone] )  [facility] []   [phone] )  [facility]   Dose also adjusted for estimated CrCl<54ml/min to 750mg  po q48h.  Next dose due 3/19.  Netta Cedars, PharmD, BCPS 04/06/2013@10 :48 AM

## 2013-04-07 MED ORDER — IPRATROPIUM BROMIDE 0.02 % IN SOLN
0.5000 mg | Freq: Four times a day (QID) | RESPIRATORY_TRACT | Status: DC
Start: 2013-04-07 — End: 2018-03-30

## 2013-04-07 MED ORDER — PREDNISONE 20 MG PO TABS: 60.0000 mg | ORAL_TABLET | Freq: Every day | ORAL | Status: DC

## 2013-04-07 MED ORDER — PREDNISONE 20 MG PO TABS
60.0000 mg | ORAL_TABLET | Freq: Every day | ORAL | Status: DC
  Administered 2013-04-07: 60 mg via ORAL
  Filled 2013-04-07: qty 3

## 2013-04-07 MED ORDER — BUDESONIDE-FORMOTEROL FUMARATE 160-4.5 MCG/ACT IN AERO: 2.0000 | INHALATION_SPRAY | Freq: Two times a day (BID) | RESPIRATORY_TRACT | Status: DC

## 2013-04-07 MED ORDER — LEVOFLOXACIN 750 MG PO TABS: 750.0000 mg | ORAL_TABLET | ORAL | Status: DC

## 2013-04-07 MED ORDER — LEVOFLOXACIN 750 MG PO TABS: 750.0000 mg | ORAL_TABLET | Freq: Every day | ORAL | Status: DC

## 2013-04-07 NOTE — Discharge Summary (Signed)
Physician Discharge Summary  Rachael Bradley ZOX:[id number] DOB: 12-06-1940 DOA: 04/03/2013  PCP: Normand Sloop, MD  Admit date: 04/03/2013 Discharge date: 04/07/2013  Time spent: 35 minutes  Recommendations for Outpatient Follow-up:  1. Resume home O2 2. Will need labs including CBC and complete metabolic panel in a week 3. Consider outpatient evaluation including PET scan as has been losing weight over the past 1-2 years 4. Followup with specialists at [facility], cardiologist in [place]  Discharge Diagnoses:  Principal Problem:   COPD exacerbation Active Problems:   COPD with acute exacerbation   Chest pain   CAD (coronary artery disease) of artery bypass graft   Lung cancer   Discharge Condition: Good  Diet recommendation: Heart healthy  Filed Weights   04/04/13 0414 04/05/13 0626 04/07/13 0521  Weight: 75.1 kg (165 lb 9.1 oz) 33.657 kg (74 lb 3.2 oz) 34.247 kg (75 lb 8 oz)    History of present illness:  73 year old African American female, known history thoracotomy for prior tobacco use, stage D. COPD, CAD status post stent, CHF-admitted 04/03/48 with chest pain as well as dyspnea on exertion. Chest pain was not radiating in nature, Patient was admitted to monitored   Hospital Course:  COPD exacerbation  -Slowly improving with IV steroids,bronchodilators, oxygen- -patient recommended to go home on Atrovent nebulizers [did not want a pill Spiriva] but she states that she cannot afford "all these medications" and asks me for samples which I do not have -Recommended at least patient continue by mouth steroids 60 mg for 5 days, prescription written for patient Chest pain  Appears respiratory in nature with chest tightness and wheezing.  2-D echo with normal EF. Serial troponins negative. Normal EKG  Chronic CHF  Euvolemic clinically. Continue aspirin and statin.  Anemia  Secondary to iron deficiency. Stool for occult blood negative. We'll add iron supplements  Hypertension   Continue amlodipine   Procedures:  Chest x-ray 3/14 = increasing no edema or consolidation  Echocardiogram 3/15 = EF 60-65% without wall motion abnormalities, PA peak 50    Consultations:  None  Discharge Exam: Filed Vitals:   04/07/13 0521  BP: 118/55  Pulse: 93  Temp: 97.5 F (36.4 C)  Resp: 20    General: Alert pleasant oriented  Cardiovascular: S1-S2 no murmur gallop  Respiratory: Clinically clear no wheeze  Discharge Instructions     Medication List    STOP taking these medications       omeprazole 20 MG tablet  Commonly known as:  PRILOSEC OTC      TAKE these medications       albuterol (2.5 MG/3ML) 0.083% nebulizer solution  Commonly known as:  PROVENTIL  Take 2.5 mg by nebulization every 6 (six) hours as needed for wheezing or shortness of breath.     amLODipine 5 MG tablet  Commonly known as:  NORVASC  Take 5 mg by mouth daily.     aspirin EC 81 MG tablet  Take 81 mg by mouth daily.     budesonide-formoterol 160-4.5 MCG/ACT inhaler  Commonly known as:  SYMBICORT  Inhale 2 puffs into the lungs 2 (two) times daily.     furosemide 20 MG tablet  Commonly known as:  LASIX  Take 20 mg by mouth as needed for fluid or edema.     ipratropium 0.02 % nebulizer solution  Commonly known as:  ATROVENT  Take 2.5 mLs (0.5 mg total) by nebulization every 6 (six) hours.     nitroGLYCERIN 0.4 MG SL tablet  Commonly known as:  NITROSTAT  Place 0.4 mg under the tongue every 5 (five) minutes as needed for chest pain.     predniSONE 20 MG tablet  Commonly known as:  DELTASONE  Take 3 tablets (60 mg total) by mouth daily before breakfast.     rosuvastatin 10 MG tablet  Commonly known as:  CRESTOR  Take 10 mg by mouth daily.       Allergies  Allergen Reactions  . Ambien [Zolpidem]   . Penicillins        Follow-up Information   Follow up with Normand Sloop, MD.   Specialty:  Family Medicine   Contact information:   [place] [id number] [phone]       Follow up In 3 days.       The results of significant diagnostics from this hospitalization (including imaging, microbiology, ancillary and laboratory) are listed below for reference.    Significant Diagnostic Studies: Dg Chest 2 View  04/03/2013   CLINICAL DATA:  Shortness of breath and cough  EXAM: CHEST  2 VIEW  COMPARISON:  None.  FINDINGS: There is underlying emphysematous change. There is an apparent nipple shadow on the left. There is postoperative change in the left upper lobe region elsewhere lungs are clear. Heart size is normal. Pulmonary vascularity reflects underlying emphysema. No adenopathy. There are stents in the left circumflex and right coronary artery regions. There are no bone lesions.  IMPRESSION: Underlying emphysema. Probable nipple shadow on the left; advise repeat study with nipple markers to confirm. No edema or consolidation. Coronary artery stents are noted.   Electronically Signed   By: Lowella Grip M.D.   On: 04/03/2013 20:43    Microbiology: No results found for this or any previous visit (from the past 240 hour(s)).   Labs: Basic Metabolic Panel:  Recent Labs Lab 04/03/13 2023 04/04/13 0225  NA 139 143  K 3.5* 4.0  CL 98 101  CO2 31 28  GLUCOSE 157* 322*  BUN 8 7  CREATININE 0.55 0.47*  CALCIUM 9.2 9.0   Liver Function Tests:  Recent Labs Lab 04/03/13 2023 04/04/13 0225  AST 20 20  ALT 10 10  ALKPHOS 81 74  BILITOT 0.6 0.6  PROT 7.4 6.7  ALBUMIN 4.1 3.6   No results found for this basename: LIPASE, AMYLASE,  in the last 168 hours No results found for this basename: AMMONIA,  in the last 168 hours CBC:  Recent Labs Lab 04/03/13 2023 04/04/13 0225 04/06/13 0603  WBC 5.5 2.8* 15.5*  NEUTROABS 2.7  --   --   HGB 8.6* 8.1* 8.6*  HCT 27.6* 26.4* 28.1*  MCV 80.2 81.0 80.7  PLT 354 321 372   Cardiac Enzymes:  Recent Labs Lab 04/03/13 2023 04/04/13 0225 04/04/13 0802  04/04/13 1425 04/06/13 0603  TROPONINI <0.30 <0.30 <0.30 <0.30 <0.30   BNP: BNP (last 3 results)  Recent Labs  04/03/13 2023  PROBNP 29.9   CBG:  Recent Labs Lab 04/05/13 1657  GLUCAP 143*       SignedNita Sells  [facility] 04/07/2013, 10:02 AM

## 2013-04-07 NOTE — Plan of Care (Signed)
Problem: Discharge Progression Outcomes Goal: Home O2 if indicated Outcome: Completed/Met Date Met:  04/07/13 04/07/13 1116 patient has home O2 set up, portable O2 here for discharge home. Donavan Foil, RN

## 2013-04-07 NOTE — Progress Notes (Signed)
04/07/13 1127 Reviewed discharge instructions with patient, husband at bedside. Given copy of AVS, prescriptions, f/u information. States will call to schedule f/u appointment. IV site d/c'd, within normal limits. Reviewed COPD education sheet, when to call MD via teachback. Pt verbalized understanding. Noted when home medications next due on AVS. Home O2 in place for transport home. Pt left floor in stable condition via w/c accompanied by nurse tech. Donavan Foil, RN

## 2013-04-07 NOTE — Discharge Instructions (Signed)
Chronic Obstructive Pulmonary Disease Exacerbation  Chronic obstructive pulmonary disease (COPD) is a common lung problem. In COPD, the flow of air from the lungs is limited. COPD exacerbations are times that breathing gets worse and you need extra treatment. Without treatment they can be life threatening. If they happen often, your lungs can become more damaged. HOME CARE  Do not smoke.  Avoid tobacco smoke and other things that bother your lungs.  If given, take your antibiotic medicine as told. Finish the medicine even if you start to feel better.  Only take medicines as told by your doctor.  Drink enough fluids to keep your pee (urine) clear or pale yellow (unless your doctor has told you not to).  Use a cool mist machine (vaporizer).  If you use oxygen or a machine that turns liquid medicine into a mist (nebulizer), continue to use them as told.  Keep up with shots (vaccinations) as told by your doctor.  Exercise regularly.  Eat healthy foods.  Keep all doctor visits as told. GET HELP RIGHT AWAY IF:  You are very short of breath and it gets worse.  You have trouble talking.  You have bad chest pain.  You have blood in your spit (sputum).  You have a fever.  You keep throwing up (vomiting).  You feel weak, or you pass out (faint).  You feel confused.  You keep getting worse. MAKE SURE YOU:   Understand these instructions.  Will watch your condition.  Will get help right away if you are not doing well or get worse. Document Released: 12/27/2010 Document Revised: 10/28/2012 Document Reviewed: 09/11/2012 [employer] Patient Information 2014 [employer].  

## 2013-06-26 ENCOUNTER — Emergency Department (HOSPITAL_COMMUNITY): Payer: MEDICARE

## 2013-06-26 ENCOUNTER — Emergency Department (HOSPITAL_COMMUNITY)
Admission: EM | Admit: 2013-06-26 | Discharge: 2013-06-26 | Disposition: A | Discharge status: Home / Self Care | Payer: MEDICARE | Attending: Emergency Medicine | Admitting: Emergency Medicine

## 2013-06-26 ENCOUNTER — Encounter (HOSPITAL_COMMUNITY): Payer: Self-pay | Admitting: Emergency Medicine

## 2013-06-26 DIAGNOSIS — I739 Peripheral vascular disease, unspecified: Secondary | ICD-10-CM | POA: Insufficient documentation

## 2013-06-26 DIAGNOSIS — I252 Old myocardial infarction: Secondary | ICD-10-CM | POA: Insufficient documentation

## 2013-06-26 DIAGNOSIS — M7989 Other specified soft tissue disorders: Secondary | ICD-10-CM | POA: Insufficient documentation

## 2013-06-26 DIAGNOSIS — J40 Bronchitis, not specified as acute or chronic: Secondary | ICD-10-CM

## 2013-06-26 DIAGNOSIS — Z9889 Other specified postprocedural states: Secondary | ICD-10-CM | POA: Insufficient documentation

## 2013-06-26 DIAGNOSIS — I2510 Atherosclerotic heart disease of native coronary artery without angina pectoris: Secondary | ICD-10-CM | POA: Insufficient documentation

## 2013-06-26 DIAGNOSIS — Z880 Allergy status to penicillin: Secondary | ICD-10-CM | POA: Insufficient documentation

## 2013-06-26 DIAGNOSIS — J441 Chronic obstructive pulmonary disease with (acute) exacerbation: Secondary | ICD-10-CM | POA: Insufficient documentation

## 2013-06-26 DIAGNOSIS — M542 Cervicalgia: Secondary | ICD-10-CM | POA: Insufficient documentation

## 2013-06-26 DIAGNOSIS — K219 Gastro-esophageal reflux disease without esophagitis: Secondary | ICD-10-CM | POA: Insufficient documentation

## 2013-06-26 DIAGNOSIS — Z79899 Other long term (current) drug therapy: Secondary | ICD-10-CM | POA: Insufficient documentation

## 2013-06-26 DIAGNOSIS — Z87891 Personal history of nicotine dependence: Secondary | ICD-10-CM | POA: Insufficient documentation

## 2013-06-26 DIAGNOSIS — I509 Heart failure, unspecified: Secondary | ICD-10-CM | POA: Insufficient documentation

## 2013-06-26 DIAGNOSIS — I10 Essential (primary) hypertension: Secondary | ICD-10-CM | POA: Insufficient documentation

## 2013-06-26 DIAGNOSIS — Z85118 Personal history of other malignant neoplasm of bronchus and lung: Secondary | ICD-10-CM | POA: Insufficient documentation

## 2013-06-26 DIAGNOSIS — Z860100 Personal history of colon polyps, unspecified: Secondary | ICD-10-CM | POA: Insufficient documentation

## 2013-06-26 DIAGNOSIS — Z9981 Dependence on supplemental oxygen: Secondary | ICD-10-CM | POA: Insufficient documentation

## 2013-06-26 DIAGNOSIS — Z7982 Long term (current) use of aspirin: Secondary | ICD-10-CM | POA: Insufficient documentation

## 2013-06-26 HISTORY — DX: Peripheral vascular disease, unspecified: I73.9

## 2013-06-26 HISTORY — DX: Dependence on supplemental oxygen: Z99.81

## 2013-06-26 HISTORY — DX: Polyp of colon: K63.5

## 2013-06-26 HISTORY — DX: Acute myocardial infarction, unspecified: I21.9

## 2013-06-26 LAB — CBC WITH DIFFERENTIAL/PLATELET
BASOS PCT: 1 % (ref 0–1)
Basophils Absolute: 0 10*3/uL (ref 0.0–0.1)
EOS ABS: 0.3 10*3/uL (ref 0.0–0.7)
Eosinophils Relative: 8 % — ABNORMAL HIGH (ref 0–5)
HEMATOCRIT: 35 % — AB (ref 36.0–46.0)
Hemoglobin: 10.7 g/dL — ABNORMAL LOW (ref 12.0–15.0)
Lymphocytes Relative: 40 % (ref 12–46)
Lymphs Abs: 1.4 10*3/uL (ref 0.7–4.0)
MCH: 26 pg (ref 26.0–34.0)
MCHC: 30.6 g/dL (ref 30.0–36.0)
MCV: 85 fL (ref 78.0–100.0)
MONO ABS: 0.4 10*3/uL (ref 0.1–1.0)
Monocytes Relative: 12 % (ref 3–12)
NEUTROS ABS: 1.3 10*3/uL — AB (ref 1.7–7.7)
Neutrophils Relative %: 39 % — ABNORMAL LOW (ref 43–77)
Platelets: 209 10*3/uL (ref 150–400)
RBC: 4.12 MIL/uL (ref 3.87–5.11)
RDW: 19.4 % — ABNORMAL HIGH (ref 11.5–15.5)
WBC: 3.4 10*3/uL — ABNORMAL LOW (ref 4.0–10.5)

## 2013-06-26 LAB — COMPREHENSIVE METABOLIC PANEL
ALK PHOS: 70 U/L (ref 39–117)
ALT: 15 U/L (ref 0–35)
AST: 22 U/L (ref 0–37)
Albumin: 3.7 g/dL (ref 3.5–5.2)
BUN: 10 mg/dL (ref 6–23)
CALCIUM: 9.2 mg/dL (ref 8.4–10.5)
CO2: 28 mEq/L (ref 19–32)
Chloride: 101 mEq/L (ref 96–112)
Creatinine, Ser: 0.54 mg/dL (ref 0.50–1.10)
GFR calc non Af Amer: >90 mL/min (ref >90)
GLUCOSE: 100 mg/dL — AB (ref 70–99)
Potassium: 4.1 mEq/L (ref 3.7–5.3)
Sodium: 139 mEq/L (ref 137–147)
Total Bilirubin: 0.5 mg/dL (ref 0.3–1.2)
Total Protein: 6.7 g/dL (ref 6.0–8.3)

## 2013-06-26 LAB — PRO B NATRIURETIC PEPTIDE: Pro B Natriuretic peptide (BNP): 41.9 pg/mL (ref 0–125)

## 2013-06-26 LAB — TROPONIN I: Troponin I: <0.3 ng/mL (ref <0.30)

## 2013-06-26 MED ORDER — ALBUTEROL SULFATE (2.5 MG/3ML) 0.083% IN NEBU: 5.0000 mg | INHALATION_SOLUTION | Freq: Once | RESPIRATORY_TRACT | Status: DC

## 2013-06-26 MED ORDER — ALBUTEROL SULFATE (2.5 MG/3ML) 0.083% IN NEBU
2.5000 mg | INHALATION_SOLUTION | Freq: Once | RESPIRATORY_TRACT | Status: AC
  Administered 2013-06-26: 2.5 mg via RESPIRATORY_TRACT
  Filled 2013-06-26: qty 3

## 2013-06-26 MED ORDER — SODIUM CHLORIDE 0.9 % IV BOLUS (SEPSIS)
700.0000 mL | Freq: Once | INTRAVENOUS | Status: AC
  Administered 2013-06-26: 700 mL via INTRAVENOUS

## 2013-06-26 MED ORDER — AZITHROMYCIN 250 MG PO TABS
500.0000 mg | ORAL_TABLET | Freq: Every day | ORAL | Status: DC
  Administered 2013-06-26: 500 mg via ORAL
  Filled 2013-06-26: qty 2

## 2013-06-26 MED ORDER — IPRATROPIUM BROMIDE 0.02 % IN SOLN: 0.5000 mg | Freq: Once | RESPIRATORY_TRACT | Status: DC

## 2013-06-26 MED ORDER — IPRATROPIUM-ALBUTEROL 0.5-2.5 (3) MG/3ML IN SOLN
3.0000 mL | Freq: Once | RESPIRATORY_TRACT | Status: AC
  Administered 2013-06-26: 3 mL via RESPIRATORY_TRACT
  Filled 2013-06-26: qty 3

## 2013-06-26 MED ORDER — ALBUTEROL SULFATE (2.5 MG/3ML) 0.083% IN NEBU
5.0000 mg | INHALATION_SOLUTION | Freq: Once | RESPIRATORY_TRACT | Status: DC
Start: 2013-06-26 — End: 2013-06-26

## 2013-06-26 MED ORDER — PREDNISONE 20 MG PO TABS: ORAL_TABLET | ORAL | Status: DC

## 2013-06-26 MED ORDER — PREDNISONE 50 MG PO TABS
60.0000 mg | ORAL_TABLET | Freq: Once | ORAL | Status: AC
  Administered 2013-06-26: 60 mg via ORAL
  Filled 2013-06-26 (×2): qty 1

## 2013-06-26 MED ORDER — AZITHROMYCIN 250 MG PO TABS: ORAL_TABLET | ORAL | Status: DC

## 2013-06-26 NOTE — Discharge Instructions (Signed)
Use your inhaler and nebulizer as needed for wheezing and shortness of breath. Take the prednisone and antibiotics until gone. Recheck if you feel worse in any way.

## 2013-06-26 NOTE — ED Notes (Signed)
Pt c/o sob, chest pain, bilateral shoulder pain and pain to neck area that started yesterday, pt   Described the pain a as "pushing" sensation and intermittent. Pt also c/o lower extremity swelling that has became worse over the past days but the swelling will decrease at nighttime, pt is on home oxygen at 2 lpm via [place],

## 2013-06-26 NOTE — ED Notes (Signed)
Pt states she feels slightly better after neb treatments

## 2013-06-26 NOTE — ED Provider Notes (Signed)
CSN: [id number]     Arrival date & time 06/26/13  1423 History   First MD Initiated Contact with Patient 06/26/13 1438   This chart was scribed for Rachael Norrie, MD by Steva Colder, ED Scribe. The patient was seen in room APA05/APA05 at 2:55 PM.   Chief Complaint  Patient presents with  . Shortness of Breath   The history is provided by the patient. No language interpreter was used.   HPI Comments: Rachael Bradley is a 73 y.o. female with h/o COPD and Emphysema who presents to the Emergency Department complaining of SOB and intermittent pressure-like chest pain that began yesterday. Pt states that her episode of CP will last 30 minutes to an hour. She also associates the chest pain as being worse when her breathing is worse. Pt states chest pain is worsened by walking and relieved by laying down. She reports associated wheezing. She has an inhaler that she uses and she last used it this morning with some relief. She has had swelling in her lower extremities that she describes as mild.. She states she has a mild cough. It is not productive. She states she always sleeps on 2-3 pillows. She uses oxygen 2 L per minute nasal cannula 24/7.  She also describes some discomfort in the right side of her neck that spreads down to her shoulders. She states movement of her head makes the pain worse.  Pt is on 2L of O2 at home which she uses 24/7. She doesn't smoke currently, the last time she smoked was 10 yrs ago. She has Lung CA and the left lung was removed. She states that she has been cancer free since 2003. [name] She has flare ups of COPD that she is sometimes admitted for, last admitted in March 2015 for COPD and excerbation.  PCP Dr Alvira Monday in [place], [place]  Pulmonologist Dr Kerin Ransom at [facility] Dr Marzetta Merino in [place], Dr Posey Pronto at [facility], MD   Past Medical History  Diagnosis Date  . CHF (congestive heart failure)   . COPD (chronic obstructive pulmonary disease)   . Coronary artery  disease   . Acid reflux   . Hypertension   . Lung cancer   . MI (myocardial infarction)   . Peripheral arterial disease   . Colon polyps   . Oxygen dependent     2 lpm via ,    Past Surgical History  Procedure Laterality Date  . Tubal ligation    .  stent    . Cholecystectomy    . Tonsillectomy    . Lobectomy    . Cardiac catheterization      with stents,   . Lung lobectomy      entire left lung,    No family history on file. History  Substance Use Topics  . Smoking status: Former Research scientist (life sciences)  . Smokeless tobacco: Not on file  . Alcohol Use: No   Lives at home Uses oxygen 24/7  OB History   Grav Para Term Preterm Abortions TAB SAB Ect Mult Living                 Review of Systems  Constitutional: Negative for fever.  Respiratory: Positive for shortness of breath and wheezing.   Cardiovascular: Positive for chest pain and leg swelling.  Musculoskeletal: Positive for neck pain (right sided).  All other systems reviewed and are negative.    Allergies  Ambien and Penicillins  Home Medications   Prior to Admission  medications   Medication Sig Start Date End Date Taking? Authorizing Provider  albuterol (PROVENTIL HFA) 108 (90 BASE) MCG/ACT inhaler Inhale 2 puffs into the lungs every 6 (six) hours as needed for wheezing or shortness of breath.   Yes Historical Provider, MD  albuterol (PROVENTIL) (2.5 MG/3ML) 0.083% nebulizer solution Take 2.5 mg by nebulization every 6 (six) hours as needed for wheezing or shortness of breath.   Yes Historical Provider, MD  amLODipine (NORVASC) 5 MG tablet Take 5 mg by mouth daily.   Yes Historical Provider, MD  aspirin EC 81 MG tablet Take 81 mg by mouth daily.   Yes Historical Provider, MD  budesonide-formoterol (SYMBICORT) 160-4.5 MCG/ACT inhaler Inhale 2 puffs into the lungs 2 (two) times daily. 04/07/13  Yes Nita Sells, MD  furosemide (LASIX) 20 MG tablet Take 20 mg by mouth as needed for fluid or edema.    Yes Historical  Provider, MD  ipratropium (ATROVENT) 0.02 % nebulizer solution Take 2.5 mLs (0.5 mg total) by nebulization every 6 (six) hours. 04/07/13  Yes Nita Sells, MD  omeprazole (PRILOSEC OTC) 20 MG tablet Take 20 mg by mouth daily.   Yes Historical Provider, MD  rosuvastatin (CRESTOR) 20 MG tablet Take 20 mg by mouth at bedtime.   Yes Historical Provider, MD  azithromycin (ZITHROMAX) 250 MG tablet Start on Sunday, June 7 and take one daily x 4d 06/26/13   Rachael Norrie, MD  nitroGLYCERIN (NITROSTAT) 0.4 MG SL tablet Place 0.4 mg under the tongue every 5 (five) minutes as needed for chest pain.    Historical Provider, MD  predniSONE (DELTASONE) 20 MG tablet Take 3 po QD x 2d starting tomorrow, then 2 po QD x 3d then 1 po QD x 3d 06/26/13   Rachael Norrie, MD   BP 157/89  Pulse 96  Temp(Src) 98.9 F (37.2 C) (Oral)  Resp 22  Ht 4\' 11"  (1.499 m)  Wt 70 lb (31.752 kg)  BMI 14.13 kg/m2  SpO2 100%  Vital signs normal   Physical Exam  Nursing note and vitals reviewed. Constitutional: She is oriented to person, place, and time. She appears well-developed and well-nourished.  Non-toxic appearance. She does not appear ill. No distress.  HENT:  Head: Normocephalic and atraumatic.  Right Ear: External ear normal.  Left Ear: External ear normal.  Nose: Nose normal. No mucosal edema or rhinorrhea.  Mouth/Throat: Oropharynx is clear and moist and mucous membranes are normal. No dental abscesses or uvula swelling.  Eyes: Conjunctivae and EOM are normal. Pupils are equal, round, and reactive to light.  Neck: Normal range of motion and full passive range of motion without pain. Neck supple.  Cardiovascular: Normal rate, regular rhythm and normal heart sounds.  Exam reveals no gallop and no friction rub.   No murmur heard. Pulmonary/Chest: Breath sounds normal. Tachypnea noted. No respiratory distress. She has no wheezes. She has no rhonchi. She has no rales. She exhibits no tenderness and no crepitus.   Diffused high pitched expiratory wheezing. Patient has egophony in the left lower chest, she has decreased tactile fremitus on her left.  Abdominal: Soft. Normal appearance and bowel sounds are normal. She exhibits no distension. There is no tenderness. There is no rebound and no guarding.  Musculoskeletal: Normal range of motion. She exhibits no edema and no tenderness.  Moves all extremities well.   Neurological: She is alert and oriented to person, place, and time. She has normal strength. No cranial nerve deficit.  Skin: Skin  is warm, dry and intact. No rash noted. No erythema. No pallor.  Psychiatric: She has a normal mood and affect. Her speech is normal and behavior is normal. Her mood appears not anxious.   ED Course  Procedures (including critical care time)  Medications  azithromycin (ZITHROMAX) tablet 500 mg (500 mg Oral Given 06/26/13 1632)  predniSONE (DELTASONE) tablet 60 mg (60 mg Oral Given 06/26/13 1506)  ipratropium-albuterol (DUONEB) 0.5-2.5 (3) MG/3ML nebulizer solution 3 mL (3 mLs Nebulization Given 06/26/13 1515)  albuterol (PROVENTIL) (2.5 MG/3ML) 0.083% nebulizer solution 2.5 mg (2.5 mg Nebulization Given 06/26/13 1515)  ipratropium-albuterol (DUONEB) 0.5-2.5 (3) MG/3ML nebulizer solution 3 mL (3 mLs Nebulization Given 06/26/13 1643)  albuterol (PROVENTIL) (2.5 MG/3ML) 0.083% nebulizer solution 2.5 mg (2.5 mg Nebulization Given 06/26/13 1643)  sodium chloride 0.9 % bolus 700 mL (700 mLs Intravenous New Bag/Given 06/26/13 1716)   DIAGNOSTIC STUDIES: Oxygen Saturation is 100% on room air, normal by my interpretation.    COORDINATION OF CARE: 3:01 PM-Discussed treatment plan with pt at bedside and pt agreed to plan. Patient given albuterol/Atrovent nebulizer.  1620 patient was rechecked after her nebulizer. She has improved air movement. She has less wheezing. Second nebulizer ordered.   Recheck at 1700 patient noted to have some tachycardia however her blood pressure was also  low in the low 90s. Her lungs were clear. We discussed all her test results. She was started on Zithromax for bronchitis. She was ambulated by nursing staff with her oxygen. Her pulse ox remained above 94% and she stated she felt fine. She was given a 700 cc bolus of fluid. After which patient states she felt improved and felt ready to be discharged.  Labs Review Results for orders placed during the hospital encounter of 06/26/13  TROPONIN I      Result Value Ref Range   Troponin I <0.30  <0.30 ng/mL  PRO B NATRIURETIC PEPTIDE      Result Value Ref Range   Pro B Natriuretic peptide (BNP) 41.9  0 - 125 pg/mL  CBC WITH DIFFERENTIAL      Result Value Ref Range   WBC 3.4 (*) 4.0 - 10.5 K/uL   RBC 4.12  3.87 - 5.11 MIL/uL   Hemoglobin 10.7 (*) 12.0 - 15.0 g/dL   HCT 35.0 (*) 36.0 - 46.0 %   MCV 85.0  78.0 - 100.0 fL   MCH 26.0  26.0 - 34.0 pg   MCHC 30.6  30.0 - 36.0 g/dL   RDW 19.4 (*) 11.5 - 15.5 %   Platelets 209  150 - 400 K/uL   Neutrophils Relative % 39 (*) 43 - 77 %   Lymphocytes Relative 40  12 - 46 %   Monocytes Relative 12  3 - 12 %   Eosinophils Relative 8 (*) 0 - 5 %   Basophils Relative 1  0 - 1 %   Neutro Abs 1.3 (*) 1.7 - 7.7 K/uL   Lymphs Abs 1.4  0.7 - 4.0 K/uL   Monocytes Absolute 0.4  0.1 - 1.0 K/uL   Eosinophils Absolute 0.3  0.0 - 0.7 K/uL   Basophils Absolute 0.0  0.0 - 0.1 K/uL   RBC Morphology TEARDROP CELLS    COMPREHENSIVE METABOLIC PANEL      Result Value Ref Range   Sodium 139  137 - 147 mEq/L   Potassium 4.1  3.7 - 5.3 mEq/L   Chloride 101  96 - 112 mEq/L   CO2 28  19 -  32 mEq/L   Glucose, Bld 100 (*) 70 - 99 mg/dL   BUN 10  6 - 23 mg/dL   Creatinine, Ser 0.54  0.50 - 1.10 mg/dL   Calcium 9.2  8.4 - 10.5 mg/dL   Total Protein 6.7  6.0 - 8.3 g/dL   Albumin 3.7  3.5 - 5.2 g/dL   AST 22  0 - 37 U/L   ALT 15  0 - 35 U/L   Alkaline Phosphatase 70  39 - 117 U/L   Total Bilirubin 0.5  0.3 - 1.2 mg/dL   GFR calc non Af Amer >90  >90 mL/min   GFR calc  Af Amer >90  >90 mL/min   Dg Chest 2 View  06/26/2013   CLINICAL DATA:  Shortness of breath. History of COPD and lymphoma. Left lung resection 2002.  EXAM: CHEST  2 VIEW  COMPARISON:  04/03/2013  FINDINGS: Lungs are hyperexpanded with flattening of the hemidiaphragms and increased AP diameter of the chest compatible with emphysematous disease/COPD. There is no focal consolidation or effusion. Surgical suture line is present over the left upper lung. Overlying left nipple density unchanged. Cardiomediastinal silhouette is within normal. There is calcified plaque over the aortic arch. Coronary stent is unchanged. Remainder of the exam is unchanged.  IMPRESSION: No acute cardiopulmonary disease.  COPD.  Postsurgical change over the left upper lung.   Electronically Signed   By: Marin Olp M.D.   On: 06/26/2013 16:00    EKG Interpretation   Date/Time:  Saturday June 26 2013 14:35:43 EDT Ventricular Rate:  93 PR Interval:  204 QRS Duration: 66 QT Interval:  357 QTC Calculation: 444 R Axis:   85 Text Interpretation:  Sinus rhythm Anterior infarct, old No significant  change since last tracing 06 Apr 2013 Confirmed by [facility]  MD-I, Kia Varnadore  ([id number]) on 06/26/2013 4:19:50 PM      MDM   Final diagnoses:  COPD exacerbation  Bronchitis     New Prescriptions   AZITHROMYCIN (ZITHROMAX) 250 MG TABLET    Start on Sunday, June 7 and take one daily x 4d   PREDNISONE (DELTASONE) 20 MG TABLET    Take 3 po QD x 2d starting tomorrow, then 2 po QD x 3d then 1 po QD x 3d    Plan discharge    I personally performed the services described in this documentation, which was scribed in my presence. The recorded information has been reviewed and considered.  Rolland Porter, MD, FACEP    Rachael Norrie, MD 06/26/13 [phone]

## 2013-06-26 NOTE — ED Notes (Signed)
Pt ambulated around nurses station on 2L of o2. Pt maintained at or above 94% the entire time.

## 2013-12-05 ENCOUNTER — Encounter (HOSPITAL_COMMUNITY): Payer: Self-pay

## 2013-12-05 ENCOUNTER — Inpatient Hospital Stay (HOSPITAL_COMMUNITY)
Admission: EM | Admit: 2013-12-05 | Discharge: 2013-12-08 | DRG: 280 | Disposition: A | Discharge status: Home / Self Care | Payer: MEDICARE | Attending: Internal Medicine | Admitting: Internal Medicine

## 2013-12-05 ENCOUNTER — Emergency Department (HOSPITAL_COMMUNITY): Payer: MEDICARE

## 2013-12-05 DIAGNOSIS — Z9981 Dependence on supplemental oxygen: Secondary | ICD-10-CM

## 2013-12-05 DIAGNOSIS — E43 Unspecified severe protein-calorie malnutrition: Secondary | ICD-10-CM | POA: Diagnosis present

## 2013-12-05 DIAGNOSIS — I5032 Chronic diastolic (congestive) heart failure: Secondary | ICD-10-CM | POA: Diagnosis present

## 2013-12-05 DIAGNOSIS — J9610 Chronic respiratory failure, unspecified whether with hypoxia or hypercapnia: Secondary | ICD-10-CM | POA: Diagnosis present

## 2013-12-05 DIAGNOSIS — I25810 Atherosclerosis of coronary artery bypass graft(s) without angina pectoris: Secondary | ICD-10-CM | POA: Diagnosis present

## 2013-12-05 DIAGNOSIS — I351 Nonrheumatic aortic (valve) insufficiency: Secondary | ICD-10-CM | POA: Diagnosis present

## 2013-12-05 DIAGNOSIS — I2510 Atherosclerotic heart disease of native coronary artery without angina pectoris: Secondary | ICD-10-CM | POA: Diagnosis present

## 2013-12-05 DIAGNOSIS — C3490 Malignant neoplasm of unspecified part of unspecified bronchus or lung: Secondary | ICD-10-CM | POA: Diagnosis present

## 2013-12-05 DIAGNOSIS — Z880 Allergy status to penicillin: Secondary | ICD-10-CM

## 2013-12-05 DIAGNOSIS — Z9861 Coronary angioplasty status: Secondary | ICD-10-CM

## 2013-12-05 DIAGNOSIS — Z85118 Personal history of other malignant neoplasm of bronchus and lung: Secondary | ICD-10-CM

## 2013-12-05 DIAGNOSIS — R079 Chest pain, unspecified: Secondary | ICD-10-CM

## 2013-12-05 DIAGNOSIS — I10 Essential (primary) hypertension: Secondary | ICD-10-CM | POA: Diagnosis present

## 2013-12-05 DIAGNOSIS — I214 Non-ST elevation (NSTEMI) myocardial infarction: Principal | ICD-10-CM | POA: Diagnosis present

## 2013-12-05 DIAGNOSIS — I739 Peripheral vascular disease, unspecified: Secondary | ICD-10-CM | POA: Diagnosis present

## 2013-12-05 DIAGNOSIS — Z79899 Other long term (current) drug therapy: Secondary | ICD-10-CM

## 2013-12-05 DIAGNOSIS — R51 Headache: Secondary | ICD-10-CM | POA: Diagnosis present

## 2013-12-05 DIAGNOSIS — J449 Chronic obstructive pulmonary disease, unspecified: Secondary | ICD-10-CM | POA: Diagnosis present

## 2013-12-05 DIAGNOSIS — I252 Old myocardial infarction: Secondary | ICD-10-CM

## 2013-12-05 DIAGNOSIS — Z7951 Long term (current) use of inhaled steroids: Secondary | ICD-10-CM

## 2013-12-05 DIAGNOSIS — I071 Rheumatic tricuspid insufficiency: Secondary | ICD-10-CM | POA: Diagnosis present

## 2013-12-05 DIAGNOSIS — J441 Chronic obstructive pulmonary disease with (acute) exacerbation: Secondary | ICD-10-CM | POA: Diagnosis present

## 2013-12-05 DIAGNOSIS — Z87891 Personal history of nicotine dependence: Secondary | ICD-10-CM

## 2013-12-05 DIAGNOSIS — K219 Gastro-esophageal reflux disease without esophagitis: Secondary | ICD-10-CM | POA: Diagnosis present

## 2013-12-05 DIAGNOSIS — J019 Acute sinusitis, unspecified: Secondary | ICD-10-CM | POA: Diagnosis not present

## 2013-12-05 DIAGNOSIS — Z66 Do not resuscitate: Secondary | ICD-10-CM | POA: Diagnosis present

## 2013-12-05 DIAGNOSIS — E785 Hyperlipidemia, unspecified: Secondary | ICD-10-CM | POA: Diagnosis present

## 2013-12-05 DIAGNOSIS — I5033 Acute on chronic diastolic (congestive) heart failure: Secondary | ICD-10-CM | POA: Diagnosis present

## 2013-12-05 DIAGNOSIS — I509 Heart failure, unspecified: Secondary | ICD-10-CM

## 2013-12-05 DIAGNOSIS — Z681 Body mass index (BMI) 19 or less, adult: Secondary | ICD-10-CM

## 2013-12-05 LAB — CBC WITH DIFFERENTIAL/PLATELET
Basophils Absolute: 0 10*3/uL (ref 0.0–0.1)
Basophils Relative: 1 % (ref 0–1)
Eosinophils Absolute: 0.3 10*3/uL (ref 0.0–0.7)
Eosinophils Relative: 8 % — ABNORMAL HIGH (ref 0–5)
HEMATOCRIT: 40 % (ref 36.0–46.0)
Hemoglobin: 13 g/dL (ref 12.0–15.0)
LYMPHS PCT: 34 % (ref 12–46)
Lymphs Abs: 1.3 10*3/uL (ref 0.7–4.0)
MCH: 29.7 pg (ref 26.0–34.0)
MCHC: 32.5 g/dL (ref 30.0–36.0)
MCV: 91.3 fL (ref 78.0–100.0)
MONO ABS: 0.5 10*3/uL (ref 0.1–1.0)
Monocytes Relative: 13 % — ABNORMAL HIGH (ref 3–12)
Neutro Abs: 1.7 10*3/uL (ref 1.7–7.7)
Neutrophils Relative %: 44 % (ref 43–77)
Platelets: 293 10*3/uL (ref 150–400)
RBC: 4.38 MIL/uL (ref 3.87–5.11)
RDW: 13.8 % (ref 11.5–15.5)
WBC: 3.9 10*3/uL — ABNORMAL LOW (ref 4.0–10.5)

## 2013-12-05 LAB — BASIC METABOLIC PANEL
Anion gap: 11 (ref 5–15)
BUN: 8 mg/dL (ref 6–23)
CALCIUM: 9.7 mg/dL (ref 8.4–10.5)
CHLORIDE: 99 meq/L (ref 96–112)
CO2: 32 meq/L (ref 19–32)
Creatinine, Ser: 0.6 mg/dL (ref 0.50–1.10)
GFR calc non Af Amer: 90 mL/min — ABNORMAL LOW (ref >90)
Glucose, Bld: 103 mg/dL — ABNORMAL HIGH (ref 70–99)
Potassium: 4.2 mEq/L (ref 3.7–5.3)
SODIUM: 142 meq/L (ref 137–147)

## 2013-12-05 LAB — TROPONIN I

## 2013-12-05 MED ORDER — SODIUM CHLORIDE 0.9 % IJ SOLN
3.0000 mL | Freq: Two times a day (BID) | INTRAMUSCULAR | Status: DC
  Administered 2013-12-05 – 2013-12-07 (×3): 3 mL via INTRAVENOUS

## 2013-12-05 MED ORDER — FUROSEMIDE 10 MG/ML IJ SOLN
20.0000 mg | Freq: Two times a day (BID) | INTRAMUSCULAR | Status: DC
  Administered 2013-12-06 (×2): 20 mg via INTRAVENOUS
  Filled 2013-12-05 (×2): qty 2

## 2013-12-05 MED ORDER — ACETAMINOPHEN 650 MG RE SUPP: 650.0000 mg | Freq: Four times a day (QID) | RECTAL | Status: DC | PRN

## 2013-12-05 MED ORDER — MOMETASONE FURO-FORMOTEROL FUM 200-5 MCG/ACT IN AERO
1.0000 | INHALATION_SPRAY | Freq: Two times a day (BID) | RESPIRATORY_TRACT | Status: DC
  Administered 2013-12-06 – 2013-12-08 (×5): 1 via RESPIRATORY_TRACT
  Filled 2013-12-05 (×2): qty 8.8

## 2013-12-05 MED ORDER — NITROGLYCERIN 0.4 MG SL SUBL
0.4000 mg | SUBLINGUAL_TABLET | SUBLINGUAL | Status: DC | PRN
  Administered 2013-12-06: 0.4 mg via SUBLINGUAL
  Filled 2013-12-05: qty 1

## 2013-12-05 MED ORDER — ALUM & MAG HYDROXIDE-SIMETH 200-200-20 MG/5ML PO SUSP
30.0000 mL | Freq: Four times a day (QID) | ORAL | Status: DC | PRN
  Administered 2013-12-06 – 2013-12-08 (×2): 30 mL via ORAL
  Filled 2013-12-05 (×2): qty 30

## 2013-12-05 MED ORDER — IPRATROPIUM BROMIDE 0.02 % IN SOLN
0.5000 mg | Freq: Three times a day (TID) | RESPIRATORY_TRACT | Status: DC
  Administered 2013-12-06: 0.5 mg via RESPIRATORY_TRACT
  Filled 2013-12-05: qty 2.5

## 2013-12-05 MED ORDER — ONDANSETRON HCL 4 MG/2ML IJ SOLN
4.0000 mg | Freq: Once | INTRAMUSCULAR | Status: AC
Start: 2013-12-05 — End: 2013-12-05
  Administered 2013-12-05: 4 mg via INTRAVENOUS
  Filled 2013-12-05: qty 2

## 2013-12-05 MED ORDER — DEXTROMETHORPHAN POLISTIREX 30 MG/5ML PO LQCR
15.0000 mg | Freq: Every evening | ORAL | Status: DC | PRN
  Filled 2013-12-05: qty 5

## 2013-12-05 MED ORDER — FUROSEMIDE 10 MG/ML IJ SOLN
40.0000 mg | Freq: Once | INTRAMUSCULAR | Status: AC
  Administered 2013-12-05: 40 mg via INTRAVENOUS
  Filled 2013-12-05: qty 4

## 2013-12-05 MED ORDER — MORPHINE SULFATE 4 MG/ML IJ SOLN
4.0000 mg | Freq: Once | INTRAMUSCULAR | Status: AC
  Administered 2013-12-05: 4 mg via INTRAVENOUS
  Filled 2013-12-05: qty 1

## 2013-12-05 MED ORDER — ACETAMINOPHEN 325 MG RE SUPP
325.0000 mg | Freq: Four times a day (QID) | RECTAL | Status: DC | PRN
  Filled 2013-12-05: qty 1

## 2013-12-05 MED ORDER — PANTOPRAZOLE SODIUM 20 MG PO TBEC
20.0000 mg | DELAYED_RELEASE_TABLET | Freq: Every day | ORAL | Status: DC
  Filled 2013-12-05: qty 1

## 2013-12-05 MED ORDER — ACETAMINOPHEN 325 MG PO TABS: 650.0000 mg | ORAL_TABLET | Freq: Four times a day (QID) | ORAL | Status: DC | PRN

## 2013-12-05 MED ORDER — ASPIRIN EC 81 MG PO TBEC
81.0000 mg | DELAYED_RELEASE_TABLET | Freq: Every day | ORAL | Status: DC
Start: 2013-12-06 — End: 2013-12-08
  Administered 2013-12-06 – 2013-12-08 (×3): 81 mg via ORAL
  Filled 2013-12-05 (×4): qty 1

## 2013-12-05 MED ORDER — ROSUVASTATIN CALCIUM 10 MG PO TABS
20.0000 mg | ORAL_TABLET | Freq: Every day | ORAL | Status: DC
  Administered 2013-12-05 – 2013-12-07 (×3): 20 mg via ORAL
  Filled 2013-12-05 (×2): qty 2
  Filled 2013-12-05: qty 1

## 2013-12-05 MED ORDER — ENOXAPARIN SODIUM 30 MG/0.3ML ~~LOC~~ SOLN
15.0000 mg | SUBCUTANEOUS | Status: DC
  Administered 2013-12-06: 15 mg via SUBCUTANEOUS
  Filled 2013-12-05: qty 0.3

## 2013-12-05 MED ORDER — DOCUSATE SODIUM 100 MG PO CAPS
100.0000 mg | ORAL_CAPSULE | Freq: Every day | ORAL | Status: DC | PRN
  Administered 2013-12-07: 100 mg via ORAL
  Filled 2013-12-05: qty 1

## 2013-12-05 MED ORDER — ALBUTEROL SULFATE (2.5 MG/3ML) 0.083% IN NEBU: 3.0000 mL | INHALATION_SOLUTION | Freq: Four times a day (QID) | RESPIRATORY_TRACT | Status: DC | PRN

## 2013-12-05 MED ORDER — AMLODIPINE BESYLATE 5 MG PO TABS
5.0000 mg | ORAL_TABLET | Freq: Every day | ORAL | Status: DC
  Administered 2013-12-06 – 2013-12-07 (×2): 5 mg via ORAL
  Filled 2013-12-05 (×2): qty 1

## 2013-12-05 MED ORDER — IPRATROPIUM BROMIDE 0.02 % IN SOLN
0.5000 mg | Freq: Four times a day (QID) | RESPIRATORY_TRACT | Status: DC
  Administered 2013-12-05: 0.5 mg via RESPIRATORY_TRACT
  Filled 2013-12-05: qty 2.5

## 2013-12-05 MED ORDER — ACETAMINOPHEN 325 MG PO TABS
325.0000 mg | ORAL_TABLET | Freq: Four times a day (QID) | ORAL | Status: DC | PRN
  Administered 2013-12-05 – 2013-12-07 (×5): 325 mg via ORAL
  Filled 2013-12-05 (×5): qty 1

## 2013-12-05 NOTE — ED Provider Notes (Signed)
CSN: [id number]     Arrival date & time 12/05/13  1910 History  This chart was scribed for Nat Christen, MD by Randa Evens, ED Scribe. This patient was seen in room APA04/APA04 and the patient's care was started at 7:30 PM.    Chief Complaint  Patient presents with  . Chest Pain  . Shortness of Breath   Patient is a 73 y.o. female presenting with chest pain and shortness of breath. The history is provided by the patient. No language interpreter was used.  Chest Pain Associated symptoms: shortness of breath   Shortness of Breath Associated symptoms: chest pain    HPI Comments: Rachael Bradley is a 73 y.o. female with PMHx of CHF, COPD, CAD, and MI in 2010 and 2012 and 5 stents placed, left lung cancer and lobectomy in 2002  who presents to the Emergency Department complaining of progressively worsening mid chest pain onset 3 days ago. She states that he pain radiates to right lateral neck. Pt states she has associated SOB, but no nausea or diaphoresis. Pt states she took a nitroglycerin prior to arrival with no relief.   Pt states that her primary cardiologist is at [facility]  Past Medical History  Diagnosis Date  . CHF (congestive heart failure)   . COPD (chronic obstructive pulmonary disease)   . Coronary artery disease   . Acid reflux   . Hypertension   . Lung cancer   . MI (myocardial infarction)   . Peripheral arterial disease   . Colon polyps   . Oxygen dependent     2 lpm via [place],    Past Surgical History  Procedure Laterality Date  . Tubal ligation    .  stent    . Cholecystectomy    . Tonsillectomy    . Lobectomy    . Cardiac catheterization      with stents,   . Lung lobectomy      entire left lung,    No family history on file. History  Substance Use Topics  . Smoking status: Former Research scientist (life sciences)  . Smokeless tobacco: Not on file  . Alcohol Use: No   OB History    No data available     Review of Systems  Respiratory: Positive for shortness of breath.    Cardiovascular: Positive for chest pain.   A complete 10 system review of systems was obtained and all systems are negative except as noted in the HPI and PMH.     Allergies  Ambien and Penicillins  Home Medications   Prior to Admission medications   Medication Sig Start Date End Date Taking? Authorizing Provider  albuterol (PROVENTIL HFA) 108 (90 BASE) MCG/ACT inhaler Inhale 2 puffs into the lungs every 6 (six) hours as needed for wheezing or shortness of breath.   Yes Historical Provider, MD  amLODipine (NORVASC) 5 MG tablet Take 5 mg by mouth daily.   Yes Historical Provider, MD  aspirin EC 81 MG tablet Take 81 mg by mouth daily.   Yes Historical Provider, MD  budesonide-formoterol (SYMBICORT) 160-4.5 MCG/ACT inhaler Inhale 2 puffs into the lungs 2 (two) times daily. 04/07/13  Yes Nita Sells, MD  ipratropium (ATROVENT) 0.02 % nebulizer solution Take 2.5 mLs (0.5 mg total) by nebulization every 6 (six) hours. 04/07/13  Yes Nita Sells, MD  nitroGLYCERIN (NITROSTAT) 0.4 MG SL tablet Place 0.4 mg under the tongue every 5 (five) minutes as needed for chest pain.   Yes Historical Provider, MD  omeprazole (PRILOSEC OTC)  20 MG tablet Take 20 mg by mouth daily.   Yes Historical Provider, MD  rosuvastatin (CRESTOR) 20 MG tablet Take 20 mg by mouth at bedtime.   Yes Historical Provider, MD  albuterol (PROVENTIL) (2.5 MG/3ML) 0.083% nebulizer solution Take 2.5 mg by nebulization every 6 (six) hours as needed for wheezing or shortness of breath.    Historical Provider, MD  azithromycin (ZITHROMAX) 250 MG tablet Start on Sunday, June 7 and take one daily x 4d Patient not taking: Reported on 12/05/2013 06/26/13   [name], MD  furosemide (LASIX) 20 MG tablet Take 20 mg by mouth as needed for fluid or edema.     Historical Provider, MD  predniSONE (DELTASONE) 20 MG tablet Take 3 po QD x 2d starting tomorrow, then 2 po QD x 3d then 1 po QD x 3d Patient not taking: Reported on  12/05/2013 06/26/13   [name], MD   Triage Vitals: BP 164/83 mmHg  Pulse 109  Temp(Src) 98.3 F (36.8 C) (Oral)  Resp 24  Ht 4\' 11"  (1.499 m)  Wt 73 lb (33.113 kg)  BMI 14.74 kg/m2  SpO2 99%  Physical Exam  Constitutional: She is oriented to person, place, and time. She appears well-developed and well-nourished.  Thin no acute distress,  HENT:  Head: Normocephalic and atraumatic.  Eyes: Conjunctivae and EOM are normal. Pupils are equal, round, and reactive to light.  Neck: Normal range of motion. Neck supple.  Cardiovascular: Normal rate, regular rhythm and normal heart sounds.   Pulmonary/Chest: Effort normal and breath sounds normal.  Abdominal: Soft. Bowel sounds are normal.  Musculoskeletal: Normal range of motion.  Neurological: She is alert and oriented to person, place, and time.  Skin: Skin is warm and dry.  Psychiatric: She has a normal mood and affect. Her behavior is normal.  Nursing note and vitals reviewed.   ED Course  Procedures (including critical care time) DIAGNOSTIC STUDIES: Oxygen Saturation is 99% on RA, normal by my interpretation.    COORDINATION OF CARE: 7:37 PM-Discussed treatment plan which includes cardiac work up, pain management and probable admission with pt at bedside and pt agreed to plan.     Labs Review Labs Reviewed  BASIC METABOLIC PANEL - Abnormal; Notable for the following:    Glucose, Bld 103 (*)    GFR calc non Af Amer 90 (*)    All other components within normal limits  CBC WITH DIFFERENTIAL - Abnormal; Notable for the following:    WBC 3.9 (*)    Monocytes Relative 13 (*)    Eosinophils Relative 8 (*)    All other components within normal limits  TROPONIN I    Imaging Review Dg Chest Port 1 View  12/05/2013   CLINICAL DATA:  Chest pain with radiation into the neck for 3 days. Progressively worsening symptoms.  EXAM: PORTABLE CHEST - 1 VIEW  COMPARISON:  06/26/2013.  FINDINGS: Cardiopericardial silhouette within normal  limits and unchanged in size. Aortic arch atherosclerosis. Emphysema. Chronic blunting of the costophrenic angles. Lung staples are present at the LEFT apex. Monitoring leads project over the chest. Compared to the prior exam, there appears to be interstitial pulmonary edema with visible interlobular septal markings at the bases. The appearance is compatible with mild CHF and interstitial pulmonary edema. Chronic bronchitic changes.  IMPRESSION: 1. Interstitial pulmonary edema compatible with mild CHF. 2. Severe emphysema. 3. LEFT apical lung resection.   Electronically Signed   By: Arvilla Market.D.  On: 12/05/2013 20:24     EKG Interpretation   Date/Time:  Sunday December 05 2013 19:26:57 EST Ventricular Rate:  108 PR Interval:  178 QRS Duration: 59 QT Interval:  368 QTC Calculation: 493 R Axis:   89 Text Interpretation:  Sinus tachycardia Left ventricular hypertrophy  Anterior infarct, old Baseline wander in lead(s) V5 Confirmed by Hendel Gatliff  MD,  Krystian Younglove ([id number]) on 12/05/2013 7:31:05 PM      MDM   Final diagnoses:  Chest pain   Patient has known coronary artery disease. She complains of chest pain with radiation to the right neck along with dyspnea. EKG shows no acute changes. Chest x-ray shows possible mild pulmonary edema. IV Lasix. Admit to general medicine.    I personally performed the services described in this documentation, which was scribed in my presence. The recorded information has been reviewed and is accurate.      Nat Christen, MD 12/05/13 2118

## 2013-12-05 NOTE — ED Notes (Signed)
Pt reports chest pain and sob that started a couple of days ago.  Pt states she took one ntg at home with some relief of pain.  Pt with history of right lung removal and copd

## 2013-12-05 NOTE — ED Notes (Signed)
Report given to [facility] on Dept [address], all questions answered.

## 2013-12-05 NOTE — ED Notes (Signed)
Hospitalist at bedside 

## 2013-12-05 NOTE — H&P (Signed)
History and Physical  Iviona Hole TKZ:[id number] DOB: 1940/08/16 DOA: 12/05/2013  Referring physician: Dr Lacinda Axon, ED physician PCP: Normand Sloop, MD   Chief Complaint: Chest pain  HPI: Avanthika Dehnert is a 73 y.o. female  With a history of myocardial infarction status post stenting, COPD with chronic respiratory failure, acid reflux, hypertension, peripheral artery disease. Her surgical history includes removal of her complete left lung and a right lobectomy secondary to cancer.  She presents with a three-day history of chest pain in the left chest that was initially intermittent and then progressed to constant tonight. Patient given nitroglycerin which did not resolve the chest pain and her family brought her here. She had accompanied shortness of breath. She denies diaphoresis. She did have some neck pain as well. She received some morphine in the hospital which decreased the chest pain and made her less short of breath. No exacerbating factors.    Review of Systems:   Pt complains of epigastric abdominal pain that started after receiving morphine.  She also has a mild chronic cough.  Pt denies any fevers, chills, nausea, vomiting, edema, palpitations, pleurisy, weight gain.  Review of systems are otherwise negative  Past Medical History  Diagnosis Date  . CHF (congestive heart failure)   . COPD (chronic obstructive pulmonary disease)   . Coronary artery disease   . Acid reflux   . Hypertension   . Lung cancer   . MI (myocardial infarction)   . Peripheral arterial disease   . Colon polyps   . Oxygen dependent     2 lpm via [place],    Past Surgical History  Procedure Laterality Date  . Tubal ligation    .  stent    . Cholecystectomy    . Tonsillectomy    . Lobectomy    . Cardiac catheterization      with stents,   . Lung lobectomy      entire left lung,    Social History:  reports that she has quit smoking. She does not have any smokeless tobacco history on file. She  reports that she does not drink alcohol or use illicit drugs. Patient lives at home & is able to participate in activities of daily living  Allergies  Allergen Reactions  . Ambien [Zolpidem]     Severe confusion/disorientation   . Penicillins Hives and Other (See Comments)    Edema    History reviewed. No pertinent family history.    Prior to Admission medications   Medication Sig Start Date End Date Taking? Authorizing Provider  albuterol (PROVENTIL HFA) 108 (90 BASE) MCG/ACT inhaler Inhale 2 puffs into the lungs every 6 (six) hours as needed for wheezing or shortness of breath.   Yes Historical Provider, MD  amLODipine (NORVASC) 5 MG tablet Take 5 mg by mouth daily.   Yes Historical Provider, MD  aspirin EC 81 MG tablet Take 81 mg by mouth daily.   Yes Historical Provider, MD  budesonide-formoterol (SYMBICORT) 160-4.5 MCG/ACT inhaler Inhale 2 puffs into the lungs 2 (two) times daily. 04/07/13  Yes Nita Sells, MD  ipratropium (ATROVENT) 0.02 % nebulizer solution Take 2.5 mLs (0.5 mg total) by nebulization every 6 (six) hours. 04/07/13  Yes Nita Sells, MD  nitroGLYCERIN (NITROSTAT) 0.4 MG SL tablet Place 0.4 mg under the tongue every 5 (five) minutes as needed for chest pain.   Yes Historical Provider, MD  omeprazole (PRILOSEC OTC) 20 MG tablet Take 20 mg by mouth daily.   Yes Historical Provider, MD  rosuvastatin (CRESTOR) 20 MG tablet Take 20 mg by mouth at bedtime.   Yes Historical Provider, MD    Physical Exam: BP 177/76 mmHg  Pulse 97  Temp(Src) 98.3 F (36.8 C) (Oral)  Resp 19  Ht 4\' 11"  (1.499 m)  Wt 33.113 kg (73 lb)  BMI 14.74 kg/m2  SpO2 98%  General: elderly black female. Awake and alert and oriented x3. No acute cardiopulmonary distress. She is very thin Eyes: Pupils equal, round, reactive to light. Extraocular muscles are intact. Sclerae anicteric and noninjected.  ENT: Moist mucosal membranes. No mucosal lesions. Teeth in moderate repair  Neck:  Neck supple without lymphadenopathy. No carotid bruits. No masses palpated.  Cardiovascular: Regular rate with normal S1-S2 sounds. No murmurs, rubs, gallops auscultated. No JVD.  Respiratory: absent breath sounds in the left. Mild rales in the right base.    Abdomen: Soft, nondistended. Tender in the epigastric region.  Active bowel sounds. No masses or hepatosplenomegaly  Skin: Dry, warm to touch. 2+ dorsalis pedis and radial pulses. Musculoskeletal: No calf or leg pain. All major joints not erythematous nontender.  Psychiatric: Intact judgment and insight.  Neurologic: No focal neurological deficits. Cranial nerves II through XII are grossly intact.           Labs on Admission:  Basic Metabolic Panel:  Recent Labs Lab 12/05/13 1930  NA 142  K 4.2  CL 99  CO2 32  GLUCOSE 103*  BUN 8  CREATININE 0.60  CALCIUM 9.7   Liver Function Tests: No results for input(s): AST, ALT, ALKPHOS, BILITOT, PROT, ALBUMIN in the last 168 hours. No results for input(s): LIPASE, AMYLASE in the last 168 hours. No results for input(s): AMMONIA in the last 168 hours. CBC:  Recent Labs Lab 12/05/13 1930  WBC 3.9*  NEUTROABS 1.7  HGB 13.0  HCT 40.0  MCV 91.3  PLT 293   Cardiac Enzymes:  Recent Labs Lab 12/05/13 1930  TROPONINI <0.30    BNP (last 3 results)  Recent Labs  04/03/13 2023 06/26/13 1445  PROBNP 29.9 41.9   CBG: No results for input(s): GLUCAP in the last 168 hours.  Radiological Exams on Admission: Dg Chest Port 1 View  12/05/2013   CLINICAL DATA:  Chest pain with radiation into the neck for 3 days. Progressively worsening symptoms.  EXAM: PORTABLE CHEST - 1 VIEW  COMPARISON:  06/26/2013.  FINDINGS: Cardiopericardial silhouette within normal limits and unchanged in size. Aortic arch atherosclerosis. Emphysema. Chronic blunting of the costophrenic angles. Lung staples are present at the LEFT apex. Monitoring leads project over the chest. Compared to the prior exam,  there appears to be interstitial pulmonary edema with visible interlobular septal markings at the bases. The appearance is compatible with mild CHF and interstitial pulmonary edema. Chronic bronchitic changes.  IMPRESSION: 1. Interstitial pulmonary edema compatible with mild CHF. 2. Severe emphysema. 3. LEFT apical lung resection.   Electronically Signed   By: Dereck Ligas M.D.   On: 12/05/2013 20:24    EKG: Independently reviewed. Sinus tachycardia at a rate of 108 bpm. LVH noted. Q waves in V1 through V3 suggestive of old anterior infarct  Assessment/Plan Present on Admission:  . Chest pain  #1 chest pain We'll admit to telemetry and rule out with serial troponins.  Continue morphine and nitroglycerin for chest pain. Will obtain echocardiogram in the morning to further evaluate.  #2 mild congestive heart failure The echocardiogram on 04/04/2013 reveals a normal EF of 60-65%. There is no mention of  diastolic failure. However, the patient has had CHF exacerbations in the past that are mild in nature. Will obtain a proBNP and obtain an echocardiogram to further evaluate. We'll continue with Lasix to diuresis and monitor strict I's and O's.  #3 COPD Continue oxygen therapy and home inhalers.  #4 hypertension Continue home medications   DVT prophylaxis: Lovenox  Consultants: none  Code Status: DO NOT INTUBATE otherwise full code  Family Communication: husband and daughters in the room   Disposition Plan: home following rule out  Time spent: 22 minutes  [place], [place] [facility] Pager [phone]

## 2013-12-05 NOTE — ED Notes (Signed)
Patient assisted to restroom, tolerated well 

## 2013-12-06 DIAGNOSIS — I2510 Atherosclerotic heart disease of native coronary artery without angina pectoris: Secondary | ICD-10-CM | POA: Diagnosis present

## 2013-12-06 DIAGNOSIS — I071 Rheumatic tricuspid insufficiency: Secondary | ICD-10-CM | POA: Diagnosis present

## 2013-12-06 DIAGNOSIS — I739 Peripheral vascular disease, unspecified: Secondary | ICD-10-CM | POA: Diagnosis not present

## 2013-12-06 DIAGNOSIS — E43 Unspecified severe protein-calorie malnutrition: Secondary | ICD-10-CM | POA: Diagnosis present

## 2013-12-06 DIAGNOSIS — I5033 Acute on chronic diastolic (congestive) heart failure: Secondary | ICD-10-CM | POA: Diagnosis present

## 2013-12-06 DIAGNOSIS — I214 Non-ST elevation (NSTEMI) myocardial infarction: Secondary | ICD-10-CM | POA: Diagnosis present

## 2013-12-06 DIAGNOSIS — Z85118 Personal history of other malignant neoplasm of bronchus and lung: Secondary | ICD-10-CM | POA: Diagnosis not present

## 2013-12-06 DIAGNOSIS — Z9981 Dependence on supplemental oxygen: Secondary | ICD-10-CM | POA: Diagnosis not present

## 2013-12-06 DIAGNOSIS — I25810 Atherosclerosis of coronary artery bypass graft(s) without angina pectoris: Secondary | ICD-10-CM | POA: Diagnosis present

## 2013-12-06 DIAGNOSIS — Z9861 Coronary angioplasty status: Secondary | ICD-10-CM | POA: Diagnosis not present

## 2013-12-06 DIAGNOSIS — I10 Essential (primary) hypertension: Secondary | ICD-10-CM | POA: Diagnosis present

## 2013-12-06 DIAGNOSIS — I209 Angina pectoris, unspecified: Secondary | ICD-10-CM | POA: Diagnosis not present

## 2013-12-06 DIAGNOSIS — Z79899 Other long term (current) drug therapy: Secondary | ICD-10-CM | POA: Diagnosis not present

## 2013-12-06 DIAGNOSIS — Z681 Body mass index (BMI) 19 or less, adult: Secondary | ICD-10-CM | POA: Diagnosis not present

## 2013-12-06 DIAGNOSIS — Z880 Allergy status to penicillin: Secondary | ICD-10-CM | POA: Diagnosis not present

## 2013-12-06 DIAGNOSIS — Z7951 Long term (current) use of inhaled steroids: Secondary | ICD-10-CM | POA: Diagnosis not present

## 2013-12-06 DIAGNOSIS — I359 Nonrheumatic aortic valve disorder, unspecified: Secondary | ICD-10-CM

## 2013-12-06 DIAGNOSIS — E785 Hyperlipidemia, unspecified: Secondary | ICD-10-CM | POA: Diagnosis present

## 2013-12-06 DIAGNOSIS — Z66 Do not resuscitate: Secondary | ICD-10-CM | POA: Diagnosis present

## 2013-12-06 DIAGNOSIS — I351 Nonrheumatic aortic (valve) insufficiency: Secondary | ICD-10-CM | POA: Diagnosis present

## 2013-12-06 DIAGNOSIS — R079 Chest pain, unspecified: Secondary | ICD-10-CM | POA: Diagnosis present

## 2013-12-06 DIAGNOSIS — K219 Gastro-esophageal reflux disease without esophagitis: Secondary | ICD-10-CM | POA: Diagnosis present

## 2013-12-06 DIAGNOSIS — J9610 Chronic respiratory failure, unspecified whether with hypoxia or hypercapnia: Secondary | ICD-10-CM | POA: Diagnosis present

## 2013-12-06 DIAGNOSIS — R51 Headache: Secondary | ICD-10-CM | POA: Diagnosis present

## 2013-12-06 DIAGNOSIS — J449 Chronic obstructive pulmonary disease, unspecified: Secondary | ICD-10-CM | POA: Diagnosis present

## 2013-12-06 DIAGNOSIS — I252 Old myocardial infarction: Secondary | ICD-10-CM | POA: Diagnosis not present

## 2013-12-06 DIAGNOSIS — Z87891 Personal history of nicotine dependence: Secondary | ICD-10-CM | POA: Diagnosis not present

## 2013-12-06 LAB — BASIC METABOLIC PANEL
Anion gap: 9 (ref 5–15)
BUN: 10 mg/dL (ref 6–23)
CO2: 33 meq/L — AB (ref 19–32)
CREATININE: 0.71 mg/dL (ref 0.50–1.10)
Calcium: 9.4 mg/dL (ref 8.4–10.5)
Chloride: 98 mEq/L (ref 96–112)
GFR calc non Af Amer: 85 mL/min — ABNORMAL LOW (ref >90)
Glucose, Bld: 106 mg/dL — ABNORMAL HIGH (ref 70–99)
POTASSIUM: 4.6 meq/L (ref 3.7–5.3)
SODIUM: 140 meq/L (ref 137–147)

## 2013-12-06 LAB — TROPONIN I
TROPONIN I: 0.5 ng/mL — AB (ref <0.30)
TROPONIN I: 0.61 ng/mL — AB (ref <0.30)

## 2013-12-06 LAB — PRO B NATRIURETIC PEPTIDE: PRO B NATRI PEPTIDE: 36.7 pg/mL (ref 0–125)

## 2013-12-06 MED ORDER — HEPARIN (PORCINE) IN NACL 100-0.45 UNIT/ML-% IJ SOLN
300.0000 [IU]/h | INTRAMUSCULAR | Status: DC
  Administered 2013-12-06: 350 [IU]/h via INTRAVENOUS
  Filled 2013-12-06: qty 250

## 2013-12-06 MED ORDER — HEPARIN BOLUS VIA INFUSION
1000.0000 [IU] | Freq: Once | INTRAVENOUS | Status: AC
  Administered 2013-12-06: 1000 [IU] via INTRAVENOUS
  Filled 2013-12-06: qty 1000

## 2013-12-06 MED ORDER — ALBUTEROL SULFATE (2.5 MG/3ML) 0.083% IN NEBU: 3.0000 mL | INHALATION_SOLUTION | RESPIRATORY_TRACT | Status: DC | PRN

## 2013-12-06 MED ORDER — IPRATROPIUM BROMIDE 0.02 % IN SOLN
0.5000 mg | Freq: Four times a day (QID) | RESPIRATORY_TRACT | Status: DC
  Administered 2013-12-06 (×2): 0.5 mg via RESPIRATORY_TRACT
  Filled 2013-12-06 (×2): qty 2.5

## 2013-12-06 MED ORDER — GI COCKTAIL ~~LOC~~
30.0000 mL | Freq: Three times a day (TID) | ORAL | Status: DC | PRN
  Administered 2013-12-06: 30 mL via ORAL
  Filled 2013-12-06: qty 30

## 2013-12-06 MED ORDER — FUROSEMIDE 20 MG PO TABS: 20.0000 mg | ORAL_TABLET | Freq: Two times a day (BID) | ORAL | Status: DC

## 2013-12-06 MED ORDER — FUROSEMIDE 20 MG PO TABS
20.0000 mg | ORAL_TABLET | Freq: Two times a day (BID) | ORAL | Status: DC
  Administered 2013-12-07 – 2013-12-08 (×3): 20 mg via ORAL
  Filled 2013-12-06 (×4): qty 1

## 2013-12-06 MED ORDER — GI COCKTAIL ~~LOC~~
30.0000 mL | Freq: Once | ORAL | Status: AC
  Administered 2013-12-06: 30 mL via ORAL
  Filled 2013-12-06: qty 30

## 2013-12-06 MED ORDER — PANTOPRAZOLE SODIUM 40 MG PO TBEC
40.0000 mg | DELAYED_RELEASE_TABLET | Freq: Every day | ORAL | Status: DC
  Administered 2013-12-06 – 2013-12-08 (×3): 40 mg via ORAL
  Filled 2013-12-06 (×4): qty 1

## 2013-12-06 MED ORDER — SALINE SPRAY 0.65 % NA SOLN
1.0000 | NASAL | Status: DC | PRN
  Administered 2013-12-06: 1 via NASAL
  Filled 2013-12-06 (×2): qty 44

## 2013-12-06 MED ORDER — HEPARIN SODIUM (PORCINE) 5000 UNIT/ML IJ SOLN: 5000.0000 [IU] | Freq: Three times a day (TID) | INTRAMUSCULAR | Status: DC

## 2013-12-06 NOTE — Progress Notes (Signed)
Patient stated her chest pain has improved. Much better than last night, but still some discomfort. Tylenol given at this time at patient request to help her rest.

## 2013-12-06 NOTE — Progress Notes (Signed)
Physician notified of patients increasing troponin. Physician plans to transfer patient to [facility]. Family made aware by physician.

## 2013-12-06 NOTE — Progress Notes (Signed)
UR completed 

## 2013-12-06 NOTE — Progress Notes (Signed)
ANTICOAGULATION CONSULT NOTE - Initial Consult  Pharmacy Consult for Heparin Indication: chest pain/ACS  Allergies  Allergen Reactions  . Ambien [Zolpidem]     Severe confusion/disorientation   . Penicillins Hives and Other (See Comments)    Edema    Patient Measurements: Height: 4\' 11"  (149.9 cm) Weight: 67 lb 3.8 oz (30.5 kg) IBW/kg (Calculated) : 43.2 Heparin Dosing Weight: 30.5  Vital Signs: Temp: 98.1 F (36.7 C) (11/16 1515) BP: 131/60 mmHg (11/16 1748) Pulse Rate: 103 (11/16 1748)  Labs:  Recent Labs  12/05/13 1930 12/06/13 0049 12/06/13 0612 12/06/13 0613 12/06/13 1141  HGB 13.0  --   --   --   --   HCT 40.0  --   --   --   --   PLT 293  --   --   --   --   CREATININE 0.60  --  0.71  --   --   TROPONINI <0.30 <0.30  --  <0.30 0.50*    Estimated Creatinine Clearance: 31.1 mL/min (by C-G formula based on Cr of 0.71).   Medical History: Past Medical History  Diagnosis Date  . CHF (congestive heart failure)   . COPD (chronic obstructive pulmonary disease)   . Coronary artery disease   . Acid reflux   . Hypertension   . Lung cancer   . MI (myocardial infarction)   . Peripheral arterial disease   . Colon polyps   . Oxygen dependent     2 lpm via [place],     Medications:  Prescriptions prior to admission  Medication Sig Dispense Refill Last Dose  . albuterol (PROVENTIL HFA) 108 (90 BASE) MCG/ACT inhaler Inhale 2 puffs into the lungs every 6 (six) hours as needed for wheezing or shortness of breath.   12/05/2013 at 1750  . amLODipine (NORVASC) 5 MG tablet Take 5 mg by mouth daily.   12/05/2013 at Unknown time  . aspirin EC 81 MG tablet Take 81 mg by mouth daily.   12/05/2013 at Unknown time  . budesonide-formoterol (SYMBICORT) 160-4.5 MCG/ACT inhaler Inhale 2 puffs into the lungs 2 (two) times daily. 1 Inhaler 12 12/05/2013 at Unknown time  . ipratropium (ATROVENT) 0.02 % nebulizer solution Take 2.5 mLs (0.5 mg total) by nebulization every 6 (six) hours.  75 mL 12 12/05/2013 at 1750  . nitroGLYCERIN (NITROSTAT) 0.4 MG SL tablet Place 0.4 mg under the tongue every 5 (five) minutes as needed for chest pain.   12/05/2013 at Unknown time  . omeprazole (PRILOSEC OTC) 20 MG tablet Take 20 mg by mouth daily.   12/05/2013 at Unknown time  . rosuvastatin (CRESTOR) 20 MG tablet Take 20 mg by mouth at bedtime.   12/04/2013 at Unknown time    Assessment: Okay for Protocol, Hx CAD/PCI.  Chest pain relieved by GI cocktail. Received prophylaxis dose of enoxaparin this AM.  Goal of Therapy:  Heparin level 0.3-0.7 units/ml Monitor platelets by anticoagulation protocol: Yes   Plan:  Give 1000 units bolus x 1 Start heparin infusion at 350 units/hr Check anti-Xa level in 6-8 hours and daily while on heparin Continue to monitor H&H and platelets  Check PT/INR in AM.  Pricilla Larsson 12/06/2013,6:24 PM

## 2013-12-06 NOTE — Plan of Care (Signed)
Asked by admitting [facility] MD to call cardiology for elevated troponins once pt arrived at [facility] from [facility]. Dr. Tommi Rumps, cardio fellow, notified. Pt is NOT having any active chest pain and EKG f/up shows no acute changes and looks same as one done on admission. Pt is already on Heparin drip, Crestor, ASA. Not on BB due to chronic COPD on 2L O2 at home. Troponins-1.   < .30   2. .  50    3.   .61  Lurlean Leyden, NP

## 2013-12-06 NOTE — Progress Notes (Signed)
CRITICAL VALUE ALERT  Critical value received:  Troponin 0.50  Date of notification:  12/06/2013  Time of notification:  1224  Critical value read back:yes  Nurse who received alert:  Jomarie Longs   MD notified (1st page):  Dr. Karleen Hampshire  Time of first page:  1224  MD notified (2nd page):  Time of second page:  Responding MD:  Dr. Karleen Hampshire  Time MD responded:  1225

## 2013-12-06 NOTE — Progress Notes (Signed)
CRITICAL VALUE ALERT  Critical value received:  Troponin 0.61  Date of notification:  11/16/20155  Time of notification:  1855  Critical value read back: yes  Nurse who received alert:  Jomarie Longs  MD notified (1st page):  Dr. Karleen Hampshire  Time of first page:  1855  MD notified (2nd page):  Time of second page:  Responding MD:  Dr. Karleen Hampshire  Time MD responded:  1900

## 2013-12-06 NOTE — Progress Notes (Signed)
INITIAL NUTRITION ASSESSMENT  DOCUMENTATION CODES Per approved criteria  -Underweight   INTERVENTION: Downgrade diet to Mechanical Soft per pt request for soft foods  NUTRITION DIAGNOSIS: Inadequate oral intake related to diet pattern as evidenced by pt hx of only 2 meals daily   Goal: Pt to meet >/= 90% of their estimated nutrition needs    Monitor:  Po intake, labs and wt trends    Reason for Assessment: Malnutrition Screen    73 y.o. female   ASSESSMENT: Rachael Bradley is a 73 y.o. female with a history of myocardial infarction status post stenting, COPD with chronic respiratory failure, acid reflux, hypertension, peripheral artery disease. Her surgical history includes removal of her complete left lung and a right lobectomy secondary to cancer. She presents with a three-day history of chest pain.   Physical exam reveals very little fat mass and no edema noted to lower extremities. Pt insists she has always been a small person and them most she ever weighed was 97# when she was pregnant.   She follows soft diet at home due to missing upper teeth and she avoids acid containing foods due to reflux. She denies changes in appetite or food intake. Weight loss trend of 4% in past 5 months does not meet criteria for significant change.  She describes her meal patteran as "full meals" at breakfast and dinner and a snack type foods for lunch. She doesn't take oral supplements such as nutrition shakes. Suspect chronic undernutrition related to usual diet pattern.    Height: Ht Readings from Last 1 Encounters:  12/05/13 4\' 11"  (1.499 m)    Weight: Wt Readings from Last 1 Encounters:  12/06/13 67 lb 3.8 oz (30.5 kg)    Ideal Body Weight: 98#  % Ideal Body Weight: 68%  Wt Readings from Last 10 Encounters:  12/06/13 67 lb 3.8 oz (30.5 kg)  06/26/13 70 lb (31.752 kg)  04/07/13 75 lb 8 oz (34.247 kg)    Usual Body Weight: 70-75#  % Usual Body Weight: 96%  BMI:  Body mass  index is 13.57 kg/(m^2). underweight  Estimated Nutritional Needs: Kcal: 1100-1300 Protein: 40-50 gr Fluid: 1.1-1.3 liters daily  Skin: intact  Diet Order: Diet regular  EDUCATION NEEDS: -No education needs identified at this time   Intake/Output Summary (Last 24 hours) at 12/06/13 1012 Last data filed at 12/06/13 0517  Gross per 24 hour  Intake      0 ml  Output      1 ml  Net     -1 ml    Last BM: 11/15  Labs:   Recent Labs Lab 12/05/13 1930 12/06/13 0612  NA 142 140  K 4.2 4.6  CL 99 98  CO2 32 33*  BUN 8 10  CREATININE 0.60 0.71  CALCIUM 9.7 9.4  GLUCOSE 103* 106*    CBG (last 3)  No results for input(s): GLUCAP in the last 72 hours.  Scheduled Meds: . amLODipine  5 mg Oral Daily  . aspirin EC  81 mg Oral Daily  . enoxaparin (LOVENOX) injection  15 mg Subcutaneous Q24H  . furosemide  20 mg Intravenous BID  . ipratropium  0.5 mg Nebulization TID  . mometasone-formoterol  1 puff Inhalation BID  . pantoprazole  40 mg Oral Daily  . rosuvastatin  20 mg Oral QHS  . sodium chloride  3 mL Intravenous Q12H    Continuous Infusions:   Past Medical History  Diagnosis Date  . CHF (congestive heart failure)   .  COPD (chronic obstructive pulmonary disease)   . Coronary artery disease   . Acid reflux   . Hypertension   . Lung cancer   . MI (myocardial infarction)   . Peripheral arterial disease   . Colon polyps   . Oxygen dependent     2 lpm via [place],     Past Surgical History  Procedure Laterality Date  . Tubal ligation    .  stent    . Cholecystectomy    . Tonsillectomy    . Lobectomy    . Cardiac catheterization      with stents,   . Lung lobectomy      entire left lung,     Colman Cater MS,RD,CSG,LDN Office: [phone] Pager: [phone]

## 2013-12-06 NOTE — Care Management Note (Addendum)
    Page 1 of 1   12/08/2013     11:47:48 AM CARE MANAGEMENT NOTE 12/08/2013  Patient:  Rachael Bradley, Rachael Bradley   Account Number:  [id number]  Date Initiated:  12/06/2013  Documentation initiated by:  Theophilus Kinds  Subjective/Objective Assessment:   Pt admitted from home with CP. Pt lives with family and will return home at discharge. Pt is independent with ADL's. Pt has home O2 with [place].     Action/Plan:   No CM needs noted. Anticipate d/c 12/07/13.   Anticipated DC Date:  12/07/2013   Anticipated DC Plan:  [place]  CM consult      Choice offered to / List presented to:             Status of service:  Completed, signed off Medicare Important Message given?  YES (If response is "NO", the following Medicare IM given date fields will be blank) Date Medicare IM given:  12/08/2013 Medicare IM given by:  GRAVES-BIGELOW,Dalayla Aldredge Date Additional Medicare IM given:   Additional Medicare IM given by:    Discharge Disposition:  HOME/SELF CARE  Per UR Regulation:    If discussed at Long Length of Stay Meetings, dates discussed:    Comments:  12/06/13 [place], RN BSN CM

## 2013-12-06 NOTE — Plan of Care (Signed)
Problem: Phase I Progression Outcomes Goal: Voiding-avoid urinary catheter unless indicated Outcome: Completed/Met Date Met:  12/06/13     

## 2013-12-06 NOTE — Progress Notes (Addendum)
[facility] PROGRESS NOTE  Rachael Bradley DXI:[id number] DOB: 11/26/1940 DOA: 12/05/2013 PCP: Normand Sloop, MD  Assessment/Plan: Atypical chest pain: Resembles like heart burn. Temporary relief with prilosec. Her chest pain not related to any activity.  One ofher troponin came back elevated at 0.5.EKG with nonspecific t wave changes in AVL and V1 V2. Echocardiogram ordered and cardiology consulted for recommendations.  As per patient, she follows at [facility] for her pulmonary ad cardiology appointments.    CAD s/p PCI: Follows with Dr Posey Pronto at [place].    COPD on 2 lit [place] oxygen: Currently no wheezing heard.  Resume bronchodilators, , Dulera. No indication of steroids.    Hypertension: Controlled. Resume home medications.   DVT prophylaxis.   Code Status: partial code Family Communication: family at bedside Disposition Plan: pending.    Consultants:  cardiology  Procedures:  Echocardiogram pending.   Antibiotics:  none  HPI/Subjective: Indigestion after eating food. No nausea, or vomiting her sob and chest discomfort resolved.   Objective: Filed Vitals:   12/06/13 0608  BP: 124/64  Pulse: 102  Temp: 98.2 F (36.8 C)  Resp: 18    Intake/Output Summary (Last 24 hours) at 12/06/13 1221 Last data filed at 12/06/13 0517  Gross per 24 hour  Intake      0 ml  Output      1 ml  Net     -1 ml   Filed Weights   12/05/13 1925 12/05/13 2216 12/06/13 0608  Weight: 33.113 kg (73 lb) 30.7 kg (67 lb 10.9 oz) 30.5 kg (67 lb 3.8 oz)    Exam:   General:  Alert afebrile on 2 lit [place] oxygen  Cardiovascular: s1s2  Respiratory: ctab  Abdomen: soft non tender non distended bowel sounds heard.   Musculoskeletal: no pedal edema.   Data Reviewed: Basic Metabolic Panel:  Recent Labs Lab 12/05/13 1930 12/06/13 0612  NA 142 140  K 4.2 4.6  CL 99 98  CO2 32 33*  GLUCOSE 103* 106*  BUN 8 10  CREATININE 0.60 0.71  CALCIUM 9.7 9.4   Liver Function  Tests: No results for input(s): AST, ALT, ALKPHOS, BILITOT, PROT, ALBUMIN in the last 168 hours. No results for input(s): LIPASE, AMYLASE in the last 168 hours. No results for input(s): AMMONIA in the last 168 hours. CBC:  Recent Labs Lab 12/05/13 1930  WBC 3.9*  NEUTROABS 1.7  HGB 13.0  HCT 40.0  MCV 91.3  PLT 293   Cardiac Enzymes:  Recent Labs Lab 12/05/13 1930 12/06/13 0049 12/06/13 0613  TROPONINI <0.30 <0.30 <0.30   BNP (last 3 results)  Recent Labs  04/03/13 2023 06/26/13 1445  PROBNP 29.9 41.9   CBG: No results for input(s): GLUCAP in the last 168 hours.  No results found for this or any previous visit (from the past 240 hour(s)).   Studies: Dg Chest Port 1 View  12/05/2013   CLINICAL DATA:  Chest pain with radiation into the neck for 3 days. Progressively worsening symptoms.  EXAM: PORTABLE CHEST - 1 VIEW  COMPARISON:  06/26/2013.  FINDINGS: Cardiopericardial silhouette within normal limits and unchanged in size. Aortic arch atherosclerosis. Emphysema. Chronic blunting of the costophrenic angles. Lung staples are present at the LEFT apex. Monitoring leads project over the chest. Compared to the prior exam, there appears to be interstitial pulmonary edema with visible interlobular septal markings at the bases. The appearance is compatible with mild CHF and interstitial pulmonary edema. Chronic bronchitic changes.  IMPRESSION: 1. Interstitial pulmonary  edema compatible with mild CHF. 2. Severe emphysema. 3. LEFT apical lung resection.   Electronically Signed   By: Dereck Ligas M.D.   On: 12/05/2013 20:24    Scheduled Meds: . amLODipine  5 mg Oral Daily  . aspirin EC  81 mg Oral Daily  . enoxaparin (LOVENOX) injection  15 mg Subcutaneous Q24H  . furosemide  20 mg Intravenous BID  . ipratropium  0.5 mg Nebulization TID  . mometasone-formoterol  1 puff Inhalation BID  . pantoprazole  40 mg Oral Daily  . rosuvastatin  20 mg Oral QHS  . sodium chloride  3 mL  Intravenous Q12H   Continuous Infusions:   Active Problems:   Chest pain   Congestive heart failure, acute    Time spent: 25 minutes    [place] [facility] Pager [phone]. If 7PM-7AM, please contact night-coverage at [email or web address], password [facility] 12/06/2013, 12:21 PM  LOS: 1 day             Addendum Notified by RN, cardiology hasn't seen the patient, today. In view of her elevated troponin and unchanged EKG and chest discomfort after eating and relieved with GI cocktail, i think her symptoms are of gi in nature, but because of her history of CAD, s/p PCI, will put her on IV heparin empirically for possible NSTEMI and get cardiology to see her in am.   Discussed with the daughter over the phone Hosie Poisson MD [phone]

## 2013-12-06 NOTE — Progress Notes (Signed)
  Echocardiogram 2D Echocardiogram has been performed.  [place], [place] 12/06/2013, 2:41 PM

## 2013-12-07 DIAGNOSIS — R110 Nausea: Secondary | ICD-10-CM

## 2013-12-07 DIAGNOSIS — I739 Peripheral vascular disease, unspecified: Secondary | ICD-10-CM

## 2013-12-07 DIAGNOSIS — I2510 Atherosclerotic heart disease of native coronary artery without angina pectoris: Secondary | ICD-10-CM

## 2013-12-07 DIAGNOSIS — C3490 Malignant neoplasm of unspecified part of unspecified bronchus or lung: Secondary | ICD-10-CM

## 2013-12-07 LAB — LIPID PANEL
Cholesterol: 180 mg/dL (ref 0–200)
HDL: 87 mg/dL (ref >39)
LDL CALC: 79 mg/dL (ref 0–99)
TRIGLYCERIDES: 68 mg/dL (ref <150)
Total CHOL/HDL Ratio: 2.1 RATIO
VLDL: 14 mg/dL (ref 0–40)

## 2013-12-07 LAB — HEPARIN LEVEL (UNFRACTIONATED)
HEPARIN UNFRACTIONATED: 0.68 [IU]/mL (ref 0.30–0.70)
Heparin Unfractionated: 0.7 IU/mL (ref 0.30–0.70)

## 2013-12-07 LAB — HEMOGLOBIN A1C
HEMOGLOBIN A1C: 6.1 % — AB (ref <5.7)
Mean Plasma Glucose: 128 mg/dL — ABNORMAL HIGH (ref <117)

## 2013-12-07 LAB — MRSA PCR SCREENING: MRSA by PCR: NEGATIVE

## 2013-12-07 MED ORDER — CETYLPYRIDINIUM CHLORIDE 0.05 % MT LIQD
7.0000 mL | Freq: Two times a day (BID) | OROMUCOSAL | Status: DC
  Administered 2013-12-07 – 2013-12-08 (×3): 7 mL via OROMUCOSAL

## 2013-12-07 MED ORDER — METOPROLOL TARTRATE 25 MG PO TABS
25.0000 mg | ORAL_TABLET | Freq: Two times a day (BID) | ORAL | Status: DC
  Administered 2013-12-07 – 2013-12-08 (×4): 25 mg via ORAL
  Filled 2013-12-07 (×5): qty 1

## 2013-12-07 MED ORDER — WHITE PETROLATUM GEL
Status: AC
  Administered 2013-12-07: 0.2
  Filled 2013-12-07: qty 5

## 2013-12-07 MED ORDER — IPRATROPIUM BROMIDE 0.02 % IN SOLN
0.5000 mg | Freq: Two times a day (BID) | RESPIRATORY_TRACT | Status: DC
  Administered 2013-12-07 – 2013-12-08 (×2): 0.5 mg via RESPIRATORY_TRACT
  Filled 2013-12-07 (×2): qty 2.5

## 2013-12-07 NOTE — Progress Notes (Signed)
[place] for Heparin Indication: chest pain/ACS  Allergies  Allergen Reactions  . Ambien [Zolpidem]     Severe confusion/disorientation   . Penicillins Hives and Other (See Comments)    Edema    Patient Measurements: Height: 4\' 11"  (149.9 cm) Weight: 66 lb (29.937 kg) IBW/kg (Calculated) : 43.2 Heparin Dosing Weight: 30.5  Vital Signs: Temp: 99.5 F (37.5 C) (11/17 1622) Temp Source: Oral (11/17 1622) BP: 100/54 mmHg (11/17 1622) Pulse Rate: 62 (11/17 1431)  Labs:  Recent Labs  12/05/13 1930  12/06/13 0612 12/06/13 0613 12/06/13 1141 12/06/13 1803 12/07/13 0655 12/07/13 1649  HGB 13.0  --   --   --   --   --   --   --   HCT 40.0  --   --   --   --   --   --   --   PLT 293  --   --   --   --   --   --   --   HEPARINUNFRC  --   --   --   --   --   --  0.68 0.70  CREATININE 0.60  --  0.71  --   --   --   --   --   TROPONINI <0.30  < >  --  <0.30 0.50* 0.61*  --   --   < > = values in this interval not displayed.  Estimated Creatinine Clearance: 30.4 mL/min (by C-G formula based on Cr of 0.71).   Assessment: 73 yo female with chest pain for heparin. Heparin level is therapeutic x2 - on higher end and possibly accumulating. It has been >24 hours since 15mg  Lovenox dose was given.   Goal of Therapy:  Heparin level 0.3-0.7 units/ml Monitor platelets by anticoagulation protocol: Yes   Plan:  Decrease heparin to 300 units/hr.  Follow-up AM labs.   Sloan Leiter, PharmD, BCPS Clinical Pharmacist [phone] 12/07/2013,5:44 PM

## 2013-12-07 NOTE — Progress Notes (Signed)
Patient Name: Rachael Bradley Date of Encounter: 12/07/2013     Active Problems:   Chest pain   Congestive heart failure, acute    SUBJECTIVE  No more chest pain, but complains of nausea and pain in feet. She says her primary cardiologist, Dr. Posey Pronto, said the next step for her would be bypass surgery .  CURRENT MEDS . amLODipine  5 mg Oral Daily  . aspirin EC  81 mg Oral Daily  . furosemide  20 mg Oral BID  . ipratropium  0.5 mg Nebulization BID  . mometasone-formoterol  1 puff Inhalation BID  . pantoprazole  40 mg Oral Daily  . rosuvastatin  20 mg Oral QHS  . sodium chloride  3 mL Intravenous Q12H    OBJECTIVE  Filed Vitals:   12/06/13 1942 12/06/13 2059 12/06/13 2212 12/07/13 0500  BP:  117/73 120/62 113/48  Pulse:  112 103 96  Temp:  98 F (36.7 C) 98.9 F (37.2 C) 98.2 F (36.8 C)  TempSrc:  Oral Oral   Resp:  20  16  Height:  4\' 11"  (1.499 m) 4\' 11"  (1.499 m)   Weight:  67 lb 0.3 oz (30.4 kg) 66 lb 1.6 oz (29.983 kg) 66 lb (29.937 kg)  SpO2: 97% 100% 99% 100%    Intake/Output Summary (Last 24 hours) at 12/07/13 0810 Last data filed at 12/07/13 0800  Gross per 24 hour  Intake    480 ml  Output    300 ml  Net    180 ml   Filed Weights   12/06/13 2059 12/06/13 2212 12/07/13 0500  Weight: 67 lb 0.3 oz (30.4 kg) 66 lb 1.6 oz (29.983 kg) 66 lb (29.937 kg)    PHYSICAL EXAM  General: Pleasant, NAD. Neuro: Alert and oriented X 3. Moves all extremities spontaneously. Psych: Normal affect. HEENT:  Normal  Neck: Supple without bruits or JVD. Lungs:  Resp regular and unlabored, CTA. Heart: RRR no s3, s4, or murmurs. Abdomen: Soft, non-tender, non-distended, BS + x 4.  Extremities: No clubbing, cyanosis or edema. DP/PT/Radials 2+ and equal bilaterally.  Accessory Clinical Findings  CBC  Recent Labs  12/05/13 1930  WBC 3.9*  NEUTROABS 1.7  HGB 13.0  HCT 40.0  MCV 91.3  PLT 742   Basic Metabolic Panel  Recent Labs  12/05/13 1930  12/06/13 0612  NA 142 140  K 4.2 4.6  CL 99 98  CO2 32 33*  GLUCOSE 103* 106*  BUN 8 10  CREATININE 0.60 0.71  CALCIUM 9.7 9.4   Cardiac Enzymes  Recent Labs  12/06/13 0613 12/06/13 1141 12/06/13 1803  TROPONINI <0.30 0.50* 0.61*   Fasting Lipid Panel  Recent Labs  12/07/13 0356  CHOL 180  HDL 87  LDLCALC 79  TRIG 68  CHOLHDL 2.1    TELE  NSR with some sinus tach   Radiology/Studies  Dg Chest Port 1 View  12/05/2013   CLINICAL DATA:  Chest pain with radiation into the neck for 3 days. Progressively worsening symptoms.  EXAM: PORTABLE CHEST - 1 VIEW  COMPARISON:  06/26/2013.  FINDINGS: Cardiopericardial silhouette within normal limits and unchanged in size. Aortic arch atherosclerosis. Emphysema. Chronic blunting of the costophrenic angles. Lung staples are present at the LEFT apex. Monitoring leads project over the chest. Compared to the prior exam, there appears to be interstitial pulmonary edema with visible interlobular septal markings at the bases. The appearance is compatible with mild CHF and interstitial pulmonary edema. Chronic bronchitic changes.  IMPRESSION: 1. Interstitial pulmonary edema compatible with mild CHF. 2. Severe emphysema. 3. LEFT apical lung resection.   Electronically Signed   By: Dereck Ligas M.D.   On: 12/05/2013 20:24    ASSESSMENT AND PLAN  45F with CAD s/p multiple PCIs, PAD, COPD on 2L home oxygen, lung cancer s/p left lung pneumonectomy and R lung lobectomy who was transferred from [facility] yesterday for NSTEMI.  NSTEMI- -- <.30--> <0.30--> <0.30--> 0.50 --> 0.61. -- ECHO at [facility] with normal EF and no WMA -- Ms. Thoman apparently had 5 stents between 2010 and 2012 although the details are not available. She states that most caths were done at [facility]. The reports are unfortunately not available via Care Everywhere. She states that she has had cath complications before - a groin bleed at one time and persistent R hand nerve pain after a radial  cath. She states that at one point she was told that there is nothing else can could be treated percutaneously and that bypass surgery may be her only options. -- Will obtain cath records. She will remain NPO for now.   SignedEileen Stanford PA-C  Pager [phone]  I have examined the patient and reviewed assessment and plan and discussed with patient.  Agree with above as stated.  Patient chest pain free with low level troponin positivity.  I personally reviewed the records from [place].  It appears that she had nonobstructive CAD in July 2013. She had patent stents at that time. She reports that during her prior catheterizations in [place], she has had some complications. She had a significant groin bleed requiring transfusion. She has had persistent right hand pain and numbness after a right radial artery cath done there as well.  We had along discussion regarding her current clinical status. She is ruling in for MI. Since it has been almost 2-1/2 years since her last cardiac cath, I recommended that we repeat this study. Due to her prior complications, she is reluctant. She would like to talk things over with her daughter. The daughter is on her way from [place]. We went over the risks and benefits of cardiac catheterization including bleeding, renal failure, stroke, need for emergency surgery among others. Currently, she is undecided about cath. If she declines cardiac cath, would plan on medical therapy with follow-up with her cardiologist at [facility].  VARANASI,JAYADEEP S.

## 2013-12-07 NOTE — Clinical Documentation Improvement (Addendum)
  Query #1 "CHF" documented in current medical record.  Treated with 40 mg IV Lasix at least times 1 since admission.  Current Lasix dose is 20 mg po bid.  Echo this admission - ef 12-82%, Grade 1 diastolic dysfunction  Please document the Acuity and Type of CHF monitored and treated this admission:  Acuity:  - Acute  - Chronic  - Acute on Chronic  AND  Type  - Systolic  - Diastolic  - Combined, Systolic and Diastolic  Query #2 Current BMI per admission orders is 13.32.  Current Registered Dietician's assessment notes patient to be "Underwieght".  Possible Clinical Conditions:  - Underweight, BMI 13.32  - Other condition  - Unable to Clinically Determine  Query #3 Patient on Crestor prior to admission.  Also prescribed Crestor following admission.  Known history of CAD and coronary interventions.  If clinically appropriate, please document a condition requiring treatment with Crestor.  Thank You, Erling Conte ,RN Clinical Documentation Specialist:  [phone] [facility]

## 2013-12-07 NOTE — Progress Notes (Signed)
[facility] PROGRESS NOTE  Teisha Trowbridge WUJ:[id number] DOB: 1941-01-04 DOA: 12/05/2013 PCP: Normand Sloop, MD    HPI/Subjective: Indigestion after eating food. No nausea, or vomiting her sob and chest discomfort resolved.   Assessment/Plan:  Active Problems:   Chest pain   Congestive heart failure, acute   Atypical chest pain: Patient presented with substernal chest pain, thought initially to be secondary to heartburn. Troponin elevated without EKG findings. Because of history of previous multiple stents transferred to [facility], cardiology consulted. Cardiology recommended cardiac catheterization, patient wants to talk to her daughter about it later today. Continue aspirin and heparin infusion. I will add metoprolol.  CAD s/p PCI: Follows with Dr Posey Pronto at [place].   COPD on 2 lit [place] oxygen: Currently no wheezing heard.  Resume bronchodilators, , Dulera. No indication of steroids.   Hypertension: Controlled. Resume home medications.    Code Status: partial code Family Communication: family at bedside Disposition Plan: pending.    Consultants:  cardiology  Procedures:  Echocardiogram pending.   Antibiotics:  none   Objective: Filed Vitals:   12/07/13 1101  BP: 113/66  Pulse:   Temp:   Resp:     Intake/Output Summary (Last 24 hours) at 12/07/13 1249 Last data filed at 12/07/13 0800  Gross per 24 hour  Intake    240 ml  Output    300 ml  Net    -60 ml   Filed Weights   12/06/13 2059 12/06/13 2212 12/07/13 0500  Weight: 30.4 kg (67 lb 0.3 oz) 29.983 kg (66 lb 1.6 oz) 29.937 kg (66 lb)    Exam:   General:  Alert afebrile on 2 lit [place] oxygen  Cardiovascular: s1s2  Respiratory: ctab  Abdomen: soft non tender non distended bowel sounds heard.   Musculoskeletal: no pedal edema.   Data Reviewed: Basic Metabolic Panel:  Recent Labs Lab 12/05/13 1930 12/06/13 0612  NA 142 140  K 4.2 4.6  CL 99 98  CO2 32 33*   GLUCOSE 103* 106*  BUN 8 10  CREATININE 0.60 0.71  CALCIUM 9.7 9.4   Liver Function Tests: No results for input(s): AST, ALT, ALKPHOS, BILITOT, PROT, ALBUMIN in the last 168 hours. No results for input(s): LIPASE, AMYLASE in the last 168 hours. No results for input(s): AMMONIA in the last 168 hours. CBC:  Recent Labs Lab 12/05/13 1930  WBC 3.9*  NEUTROABS 1.7  HGB 13.0  HCT 40.0  MCV 91.3  PLT 293   Cardiac Enzymes:  Recent Labs Lab 12/05/13 1930 12/06/13 0049 12/06/13 0613 12/06/13 1141 12/06/13 1803  TROPONINI <0.30 <0.30 <0.30 0.50* 0.61*   BNP (last 3 results)  Recent Labs  04/03/13 2023 06/26/13 1445 12/06/13 2014  PROBNP 29.9 41.9 36.7   CBG: No results for input(s): GLUCAP in the last 168 hours.  Recent Results (from the past 240 hour(s))  MRSA PCR Screening     Status: None   Collection Time: 12/06/13 10:36 PM  Result Value Ref Range Status   MRSA by PCR NEGATIVE NEGATIVE Final    Comment:        The GeneXpert MRSA Assay (FDA approved for NASAL specimens only), is one component of a comprehensive MRSA colonization surveillance program. It is not intended to diagnose MRSA infection nor to guide or monitor treatment for MRSA infections.      Studies: Dg Chest Port 1 View  12/05/2013   CLINICAL DATA:  Chest pain with radiation into the neck for 3 days. Progressively  worsening symptoms.  EXAM: PORTABLE CHEST - 1 VIEW  COMPARISON:  06/26/2013.  FINDINGS: Cardiopericardial silhouette within normal limits and unchanged in size. Aortic arch atherosclerosis. Emphysema. Chronic blunting of the costophrenic angles. Lung staples are present at the LEFT apex. Monitoring leads project over the chest. Compared to the prior exam, there appears to be interstitial pulmonary edema with visible interlobular septal markings at the bases. The appearance is compatible with mild CHF and interstitial pulmonary edema. Chronic bronchitic changes.  IMPRESSION: 1.  Interstitial pulmonary edema compatible with mild CHF. 2. Severe emphysema. 3. LEFT apical lung resection.   Electronically Signed   By: Dereck Ligas M.D.   On: 12/05/2013 20:24    Scheduled Meds: . amLODipine  5 mg Oral Daily  . aspirin EC  81 mg Oral Daily  . furosemide  20 mg Oral BID  . ipratropium  0.5 mg Nebulization BID  . mometasone-formoterol  1 puff Inhalation BID  . pantoprazole  40 mg Oral Daily  . rosuvastatin  20 mg Oral QHS  . sodium chloride  3 mL Intravenous Q12H   Continuous Infusions: . heparin 350 Units/hr (12/07/13 0800)    Active Problems:   Chest pain   Congestive heart failure, acute    Time spent: 25 minutes    [facility] Pager [phone]. If 7PM-7AM, please contact night-coverage at [email or web address], password [facility] 12/07/2013, 12:49 PM  LOS: 2 days

## 2013-12-07 NOTE — Progress Notes (Signed)
[place] for Heparin Indication: chest pain/ACS  Allergies  Allergen Reactions  . Ambien [Zolpidem]     Severe confusion/disorientation   . Penicillins Hives and Other (See Comments)    Edema    Patient Measurements: Height: 4\' 11"  (149.9 cm) Weight: 66 lb (29.937 kg) IBW/kg (Calculated) : 43.2 Heparin Dosing Weight: 30.5  Vital Signs: Temp: 98.2 F (36.8 C) (11/17 0500) Temp Source: Oral (11/16 2212) BP: 113/48 mmHg (11/17 0500) Pulse Rate: 96 (11/17 0500)  Labs:  Recent Labs  12/05/13 1930  12/06/13 0612 12/06/13 0613 12/06/13 1141 12/06/13 1803 12/07/13 0655  HGB 13.0  --   --   --   --   --   --   HCT 40.0  --   --   --   --   --   --   PLT 293  --   --   --   --   --   --   HEPARINUNFRC  --   --   --   --   --   --  0.68  CREATININE 0.60  --  0.71  --   --   --   --   TROPONINI <0.30  < >  --  <0.30 0.50* 0.61*  --   < > = values in this interval not displayed.  Estimated Creatinine Clearance: 30.4 mL/min (by C-G formula based on Cr of 0.71).   Assessment: 73 yo female with chest pain for heparin  Goal of Therapy:  Heparin level 0.3-0.7 units/ml Monitor platelets by anticoagulation protocol: Yes   Plan:  Continue Heparin at current rate  Recheck level in 8 hrs to verify  Caryl Pina 12/07/2013,7:37 AM

## 2013-12-07 NOTE — Consult Note (Addendum)
CARDIOLOGY CONSULT NOTE   Patient ID: Rachael Bradley MRN: [id number], DOB/AGE: October 06, 1940   Admit date: 12/05/2013 Date of Consult: 12/07/2013   Primary Physician: Rachael Sloop, MD Primary Cardiologist: Dr. Janith Bradley in [place] and Dr. Dionne Bradley at [facility]  Pt. Profile 46F with CAD s/p multiple PCIs, PAD, COPD on 2L home oxygen, lung cancer s/p left lung pneumonectomy and R lung lobectomy who presents with NSTEMI.   Problem List  Past Medical History  Diagnosis Date  . CHF (congestive heart failure)   . COPD (chronic obstructive pulmonary disease)   . Coronary artery disease   . Acid reflux   . Hypertension   . Lung cancer   . MI (myocardial infarction)   . Peripheral arterial disease   . Colon polyps   . Oxygen dependent     2 lpm via [place],     Past Surgical History  Procedure Laterality Date  . Tubal ligation    .  stent    . Cholecystectomy    . Tonsillectomy    . Lobectomy    . Cardiac catheterization      with stents,   . Lung lobectomy      entire left lung,      Allergies  Allergies  Allergen Reactions  . Ambien [Zolpidem]     Severe confusion/disorientation   . Penicillins Hives and Other (See Comments)    Edema    HPI  46F with CAD s/p multiple PCIs, PAD, COPD on 2L home oxygen, lung cancer s/p left lung pneumonectomy and R lung lobectomy who presents with NSTEMI.   Rachael Bradley reports that for the past 3 days she experienced worsened SOB and CP ("ache" across the chest). States CP has been essentially constant, improved slightly with SL NTG. No other associated sx. She is not sure if these symptoms were similar to prior MI.  She presented to [facility] and and was admitted to the hospitalist service. Troponins were cycled and turned positive: <0.30--> <0.30--> <0.30--> 0.50 --> 0.61. Review of ECG does demonstrate dynamic TW changes, most notably in the inferior leads. TW abnormalities did return to baseline. An echo was performed and it demonstrated normal  EF without WMA. She was started on heparin and transferred to [facility] for further care. She was chest pain free on arrival.   Rachael Bradley apparently had 5 stents between 2010 and 2012 although the details are not available. She states that most caths were done at [facility]. The reports are unfortunately not available via Care Everywhere and I cannot log onto the [facility] system remotely. She states that she has had cath complications before - a groin bleed at one time and persistent R hand nerve pain after a radial cath. She states that at one point she was told that there is nothing else can could be treated percutaneously.   Inpatient Medications  . amLODipine  5 mg Oral Daily  . aspirin EC  81 mg Oral Daily  . furosemide  20 mg Oral BID  . ipratropium  0.5 mg Nebulization Q6H  . mometasone-formoterol  1 puff Inhalation BID  . pantoprazole  40 mg Oral Daily  . rosuvastatin  20 mg Oral QHS  . sodium chloride  3 mL Intravenous Q12H    Family History History reviewed. No pertinent family history.   Social History History   Social History  . Marital Status: Married    Spouse Name: N/A    Number of Children: N/A  . Years of Education:  N/A   Occupational History  . Not on file.   Social History Main Topics  . Smoking status: Former Research scientist (life sciences)  . Smokeless tobacco: Not on file  . Alcohol Use: No  . Drug Use: No  . Sexual Activity: Not on file   Other Topics Concern  . Not on file   Social History Narrative     Review of Systems  General:  No chills, fever, night sweats or weight changes.  Cardiovascular:  See HPI Dermatological: No rash, lesions/masses Respiratory: No cough. + dyspnea Urologic: No hematuria, dysuria Abdominal:   No nausea, vomiting, diarrhea, bright red blood per rectum, melena, or hematemesis Neurologic:  No visual changes, wkns, changes in mental status. All other systems reviewed and are otherwise negative except as noted above.  Physical Exam  Blood pressure  120/62, pulse 103, temperature 98.9 F (37.2 C), temperature source Oral, resp. rate 20, height 4\' 11"  (1.499 m), weight 29.983 kg (66 lb 1.6 oz), SpO2 99 %.  General: Pleasant, NAD Psych: Normal affect. Neuro: Alert and oriented X 3. Moves all extremities spontaneously. HEENT: Normal  Neck: Supple without bruits or JVD. Lungs:  Resp regular and unlabored, CTA. Wearing [place] Heart: RRR no s3, s4, or murmurs. Abdomen: Soft, non-tender, non-distended, BS + x 4. + abdominal bruit. Extremities: No clubbing, cyanosis or edema. DP/PT/Radials 2+ and equal bilaterally. Bilateral femoral bruits, L>R. 2+ R femoral pulse, 1+ left femoral pulse.   Labs   Recent Labs  12/06/13 0049 12/06/13 0613 12/06/13 1141 12/06/13 1803  TROPONINI <0.30 <0.30 0.50* 0.61*   Lab Results  Component Value Date   WBC 3.9* 12/05/2013   HGB 13.0 12/05/2013   HCT 40.0 12/05/2013   MCV 91.3 12/05/2013   PLT 293 12/05/2013    Recent Labs Lab 12/06/13 0612  NA 140  K 4.6  CL 98  CO2 33*  BUN 10  CREATININE 0.71  CALCIUM 9.4  GLUCOSE 106*   No results found for: CHOL, HDL, LDLCALC, TRIG No results found for: DDIMER  Radiology/Studies  Dg Chest Port 1 View  12/05/2013   CLINICAL DATA:  Chest pain with radiation into the neck for 3 days. Progressively worsening symptoms.  EXAM: PORTABLE CHEST - 1 VIEW  COMPARISON:  06/26/2013.  FINDINGS: Cardiopericardial silhouette within normal limits and unchanged in size. Aortic arch atherosclerosis. Emphysema. Chronic blunting of the costophrenic angles. Lung staples are present at the LEFT apex. Monitoring leads project over the chest. Compared to the prior exam, there appears to be interstitial pulmonary edema with visible interlobular septal markings at the bases. The appearance is compatible with mild CHF and interstitial pulmonary edema. Chronic bronchitic changes.  IMPRESSION: 1. Interstitial pulmonary edema compatible with mild CHF. 2. Severe emphysema. 3. LEFT  apical lung resection.   Electronically Signed   By: Dereck Ligas M.D.   On: 12/05/2013 20:24    TTE 12/06/13 - Left ventricle: The cavity size was normal. Wall thickness was normal. Systolic function was normal. The estimated ejection fraction was in the range of 50% to 55%. Wall motion was normal; there were no regional wall motion abnormalities. Doppler parameters are consistent with abnormal left ventricular relaxation (grade 1 diastolic dysfunction). - Aortic valve: Probably trileaflet; mildly thickened leaflets. There was mild regurgitation. Mean gradient (S): 3 mm Hg. - Right atrium: Central venous pressure (est): 3 mm Hg. - Tricuspid valve: There was mild regurgitation. - Pulmonary arteries: PA peak pressure: 39 mm Hg (S). - Pericardium, extracardiac: There was no pericardial  effusion. Impressions: - Normal LV wall thickness with LVEF 14-38%, grade 1 diastolic dysfunction. Sclerotic aortic valve with mild aortic regurgitation. Mild tricuspid regurgitation with PASP 39 mmHg.  ECG 11/15 ST, old anterior infarct 12/06/13 1:24 - ST, diffuse TW abnormalities, most notable in inferior leads. old septal MI 12/06/13 12:32 ST old septal infarct. TW abnormalities have resolved.   ASSESSMENT AND PLAN  69F with CAD s/p multiple PCIs, PAD, COPD on 2L home oxygen, lung cancer s/p left lung pneumonectomy and R lung lobectomy who presents with NSTEMI. It will be useful to obtain the [facility] cath reports prior to making a final determination re: cath particularly given her reports and history of access site complications. Although her code status currently states no intubation, she is aware that this would need to be reversed if a cath is ultimately recommended after obtaining data from prior caths.  Of note, she is apparently not on a beta blocker due to her advanced lung disease.   1. Continue UFH, aspirin, crestor 2. Obtain old cath records 3. NPO (I ordered) for possible cath 4. EF  assessment has already been obtained 5. Check A1c and lipids (ordered)     Signed, Lamar Sprinkles, MD 12/07/2013, 12:24 AM

## 2013-12-07 NOTE — Plan of Care (Signed)
Problem: Phase II Progression Outcomes Goal: Cath/PCI Day Path if indicated Outcome: Not Applicable Date Met:  02/63/78

## 2013-12-07 NOTE — Plan of Care (Signed)
Problem: Phase II Progression Outcomes Goal: Hemodynamically stable Outcome: Completed/Met Date Met:  12/07/13

## 2013-12-08 DIAGNOSIS — I214 Non-ST elevation (NSTEMI) myocardial infarction: Secondary | ICD-10-CM | POA: Diagnosis present

## 2013-12-08 DIAGNOSIS — I209 Angina pectoris, unspecified: Secondary | ICD-10-CM

## 2013-12-08 DIAGNOSIS — J0100 Acute maxillary sinusitis, unspecified: Secondary | ICD-10-CM

## 2013-12-08 DIAGNOSIS — I5031 Acute diastolic (congestive) heart failure: Secondary | ICD-10-CM

## 2013-12-08 DIAGNOSIS — I2510 Atherosclerotic heart disease of native coronary artery without angina pectoris: Secondary | ICD-10-CM | POA: Diagnosis present

## 2013-12-08 DIAGNOSIS — I25118 Atherosclerotic heart disease of native coronary artery with other forms of angina pectoris: Secondary | ICD-10-CM

## 2013-12-08 DIAGNOSIS — J441 Chronic obstructive pulmonary disease with (acute) exacerbation: Secondary | ICD-10-CM | POA: Diagnosis present

## 2013-12-08 DIAGNOSIS — I5033 Acute on chronic diastolic (congestive) heart failure: Secondary | ICD-10-CM

## 2013-12-08 DIAGNOSIS — J019 Acute sinusitis, unspecified: Secondary | ICD-10-CM | POA: Diagnosis not present

## 2013-12-08 DIAGNOSIS — I5032 Chronic diastolic (congestive) heart failure: Secondary | ICD-10-CM | POA: Diagnosis present

## 2013-12-08 DIAGNOSIS — R0789 Other chest pain: Secondary | ICD-10-CM

## 2013-12-08 LAB — BASIC METABOLIC PANEL
ANION GAP: 8 (ref 5–15)
BUN: 17 mg/dL (ref 6–23)
CALCIUM: 9 mg/dL (ref 8.4–10.5)
CO2: 33 meq/L — AB (ref 19–32)
Chloride: 93 mEq/L — ABNORMAL LOW (ref 96–112)
Creatinine, Ser: 0.77 mg/dL (ref 0.50–1.10)
GFR calc Af Amer: >90 mL/min (ref >90)
GFR calc non Af Amer: 83 mL/min — ABNORMAL LOW (ref >90)
Glucose, Bld: 108 mg/dL — ABNORMAL HIGH (ref 70–99)
Potassium: 4 mEq/L (ref 3.7–5.3)
SODIUM: 134 meq/L — AB (ref 137–147)

## 2013-12-08 LAB — HEPARIN LEVEL (UNFRACTIONATED): Heparin Unfractionated: 0.66 IU/mL (ref 0.30–0.70)

## 2013-12-08 LAB — CBC
HCT: 37.6 % (ref 36.0–46.0)
Hemoglobin: 11.9 g/dL — ABNORMAL LOW (ref 12.0–15.0)
MCH: 28.5 pg (ref 26.0–34.0)
MCHC: 31.6 g/dL (ref 30.0–36.0)
MCV: 90 fL (ref 78.0–100.0)
Platelets: 252 10*3/uL (ref 150–400)
RBC: 4.18 MIL/uL (ref 3.87–5.11)
RDW: 13.8 % (ref 11.5–15.5)
WBC: 4 10*3/uL (ref 4.0–10.5)

## 2013-12-08 LAB — PROTIME-INR
INR: 1.03 (ref 0.00–1.49)
Prothrombin Time: 13.6 seconds (ref 11.6–15.2)

## 2013-12-08 LAB — TROPONIN I

## 2013-12-08 MED ORDER — OXYMETAZOLINE HCL 0.05 % NA SOLN
1.0000 | Freq: Two times a day (BID) | NASAL | Status: DC
  Administered 2013-12-08: 1 via NASAL
  Filled 2013-12-08: qty 15

## 2013-12-08 MED ORDER — FUROSEMIDE 20 MG PO TABS: 20.0000 mg | ORAL_TABLET | Freq: Two times a day (BID) | ORAL | Status: DC

## 2013-12-08 MED ORDER — HYDROCODONE-ACETAMINOPHEN 10-325 MG PO TABS
1.0000 | ORAL_TABLET | ORAL | Status: DC | PRN
  Administered 2013-12-08: 1 via ORAL
  Filled 2013-12-08: qty 1

## 2013-12-08 MED ORDER — ISOSORBIDE MONONITRATE 15 MG HALF TABLET: 15.0000 mg | ORAL_TABLET | Freq: Every day | ORAL | Status: DC

## 2013-12-08 MED ORDER — TRAMADOL HCL 50 MG PO TABS: 50.0000 mg | ORAL_TABLET | Freq: Four times a day (QID) | ORAL | Status: DC | PRN

## 2013-12-08 MED ORDER — TRAMADOL HCL 50 MG PO TABS
50.0000 mg | ORAL_TABLET | Freq: Four times a day (QID) | ORAL | Status: DC | PRN
  Administered 2013-12-08: 50 mg via ORAL
  Filled 2013-12-08: qty 1

## 2013-12-08 MED ORDER — ONDANSETRON HCL 4 MG/2ML IJ SOLN: 4.0000 mg | Freq: Four times a day (QID) | INTRAMUSCULAR | Status: DC | PRN

## 2013-12-08 MED ORDER — ENSURE COMPLETE PO LIQD: 237.0000 mL | Freq: Two times a day (BID) | ORAL | Status: DC

## 2013-12-08 MED ORDER — ISOSORBIDE MONONITRATE 15 MG HALF TABLET
15.0000 mg | ORAL_TABLET | Freq: Every day | ORAL | Status: DC
  Administered 2013-12-08: 15 mg via ORAL
  Filled 2013-12-08: qty 1

## 2013-12-08 MED ORDER — GUAIFENESIN ER 600 MG PO TB12
600.0000 mg | ORAL_TABLET | Freq: Two times a day (BID) | ORAL | Status: DC
  Administered 2013-12-08: 600 mg via ORAL
  Filled 2013-12-08: qty 1

## 2013-12-08 MED ORDER — OXYMETAZOLINE HCL 0.05 % NA SOLN: 1.0000 | Freq: Two times a day (BID) | NASAL | Status: DC

## 2013-12-08 MED ORDER — METOPROLOL TARTRATE 25 MG PO TABS: 25.0000 mg | ORAL_TABLET | Freq: Two times a day (BID) | ORAL | Status: AC

## 2013-12-08 MED ORDER — ONDANSETRON HCL 4 MG/2ML IJ SOLN
4.0000 mg | Freq: Once | INTRAMUSCULAR | Status: AC
  Administered 2013-12-08: 4 mg via INTRAVENOUS
  Filled 2013-12-08: qty 2

## 2013-12-08 MED ORDER — GUAIFENESIN ER 600 MG PO TB12
600.0000 mg | ORAL_TABLET | Freq: Two times a day (BID) | ORAL | Status: DC
  Administered 2013-12-08 (×2): 600 mg via ORAL
  Filled 2013-12-08 (×2): qty 1

## 2013-12-08 MED ORDER — GUAIFENESIN ER 600 MG PO TB12: 600.0000 mg | ORAL_TABLET | Freq: Two times a day (BID) | ORAL | Status: DC

## 2013-12-08 MED ORDER — ALUM & MAG HYDROXIDE-SIMETH 200-200-20 MG/5ML PO SUSP: 30.0000 mL | Freq: Four times a day (QID) | ORAL | Status: DC | PRN

## 2013-12-08 NOTE — Progress Notes (Signed)
ANTICOAGULATION CONSULT NOTE - Initial Consult  Pharmacy Consult for heparin Indication: chest pain/ACS  Allergies  Allergen Reactions  . Ambien [Zolpidem]     Severe confusion/disorientation   . Penicillins Hives and Other (See Comments)    Edema    Patient Measurements: Height: 4\' 11"  (149.9 cm) Weight: 64 lb 12.8 oz (29.393 kg) IBW/kg (Calculated) : 43.2 Heparin Dosing Weight: 29.4 kg  Vital Signs: Temp: 98.3 F (36.8 C) (11/18 0418) BP: 122/66 mmHg (11/18 0750) Pulse Rate: 72 (11/18 0650)  Labs:  Recent Labs  12/05/13 1930  12/06/13 0612 12/06/13 0613 12/06/13 1141 12/06/13 1803 12/07/13 0655 12/07/13 1649 12/08/13 0445  HGB 13.0  --   --   --   --   --   --   --  11.9*  HCT 40.0  --   --   --   --   --   --   --  37.6  PLT 293  --   --   --   --   --   --   --  252  LABPROT  --   --   --   --   --   --   --   --  13.6  INR  --   --   --   --   --   --   --   --  1.03  HEPARINUNFRC  --   --   --   --   --   --  0.68 0.70 0.66  CREATININE 0.60  --  0.71  --   --   --   --   --  0.77  TROPONINI <0.30  < >  --  <0.30 0.50* 0.61*  --   --   --   < > = values in this interval not displayed.  Estimated Creatinine Clearance: 29.9 mL/min (by C-G formula based on Cr of 0.77).   Medical History: Past Medical History  Diagnosis Date  . CHF (congestive heart failure)   . COPD (chronic obstructive pulmonary disease)   . Coronary artery disease   . Acid reflux   . Hypertension   . Lung cancer   . MI (myocardial infarction)   . Peripheral arterial disease   . Colon polyps   . Oxygen dependent     2 lpm via [place],     Medications:  Prescriptions prior to admission  Medication Sig Dispense Refill Last Dose  . albuterol (PROVENTIL HFA) 108 (90 BASE) MCG/ACT inhaler Inhale 2 puffs into the lungs every 6 (six) hours as needed for wheezing or shortness of breath.   12/05/2013 at 1750  . amLODipine (NORVASC) 5 MG tablet Take 5 mg by mouth daily.   12/05/2013 at  Unknown time  . aspirin EC 81 MG tablet Take 81 mg by mouth daily.   12/05/2013 at Unknown time  . budesonide-formoterol (SYMBICORT) 160-4.5 MCG/ACT inhaler Inhale 2 puffs into the lungs 2 (two) times daily. 1 Inhaler 12 12/05/2013 at Unknown time  . ipratropium (ATROVENT) 0.02 % nebulizer solution Take 2.5 mLs (0.5 mg total) by nebulization every 6 (six) hours. 75 mL 12 12/05/2013 at 1750  . nitroGLYCERIN (NITROSTAT) 0.4 MG SL tablet Place 0.4 mg under the tongue every 5 (five) minutes as needed for chest pain.   12/05/2013 at Unknown time  . omeprazole (PRILOSEC OTC) 20 MG tablet Take 20 mg by mouth daily.   12/05/2013 at Unknown time  . rosuvastatin (CRESTOR) 20 MG tablet  Take 20 mg by mouth at bedtime.   12/04/2013 at Unknown time    Assessment: 73 yo female admitted for chest pain/NSTEMI. Pt has history of CAD and has had multiple stents placed between 2010-2012. It appears pt will be managed medically.  Pt continues on heparin gtt, hgb down to 11.9, plts wnl, heparin level is therapeutic this morning. Pt had nose bleed overnight, which has since resolved.    Goal of Therapy:  Heparin level 0.3-0.7 units/ml Monitor platelets by anticoagulation protocol: Yes   Plan:  Continue heparin at 300 units/hr Daily HL, CBC F/u duration of heparin gtt  Continue to monitor for s/sx bleeding   Hughes Better, PharmD, BCPS Clinical Pharmacist Pager: [phone] 12/08/2013 8:47 AM

## 2013-12-08 NOTE — Progress Notes (Signed)
NUTRITION FOLLOW UP / [place] Per approved criteria  -Underweight -Severe malnutrition in the context of chronic illness   Pt meets criteria for severe MALNUTRITION in the context of chronic illness as evidenced by severe depletion of muscle and subcutaneous fat mass.  Intervention:    Ensure Complete PO BID, each supplement provides 350 kcal and 13 grams of protein  Nutrition Dx:   Malnutrition related to inadequate oral intake as evidenced by severe depletion of muscle and subcutaneous fat mass. Ongoing.  Goal:   Intake to meet >90% of estimated nutrition needs. Unmet.  Monitor:   PO intake, labs, weight trend.  Assessment:   73 y/o female with hx of MI s/p stenting, COPD with chronic resp failure on home o2 , complete left lung lobectomy for cancer , HTN, GERD and PAD admitted for chest pain with NSTEMI. Admitted initially to [facility] and transferred to [facility] on 11/16.  Patient reports that she weighed 97 lbs when she got married, but weight has dropped since then. She reports that she vomited up her pills after breakfast this AM. Lunch tray at bedside, patient ate all of the jello and a few bites of her sandwich. She agreed to try Ensure supplements to maximize oral intake.  Nutrition Focused Physical Exam:  Subcutaneous Fat:  Orbital Region: moderate depletion Upper Arm Region: severe depletion Thoracic and Lumbar Region: moderate depletion  Muscle:  Temple Region: mild depletion Clavicle Bone Region: severe depletion Clavicle and Acromion Bone Region: severe depletion Scapular Bone Region: moderate depletion Dorsal Hand: NA Patellar Region: severe depletion Anterior Thigh Region: moderate depletion Posterior Calf Region: severe depletion  Edema: none   Height: Ht Readings from Last 1 Encounters:  12/06/13 4\' 11"  (1.499 m)    Weight Status:   Wt Readings from Last 1 Encounters:  12/08/13 64 lb 12.8 oz (29.393 kg)   12/06/13 67 lb 3.8 oz  (30.5 kg)   Body mass index is 13.08 kg/(m^2). Underweight  Re-estimated needs:  Kcal: 1050-1200 Protein: 50-60 gm Fluid: >/= 1.2 L  Skin: WDL  Diet Order: Diet Heart   Intake/Output Summary (Last 24 hours) at 12/08/13 1327 Last data filed at 12/08/13 0224  Gross per 24 hour  Intake    200 ml  Output    650 ml  Net   -450 ml    Last BM: 11/15   Labs:   Recent Labs Lab 12/05/13 1930 12/06/13 0612 12/08/13 0445  NA 142 140 134*  K 4.2 4.6 4.0  CL 99 98 93*  CO2 32 33* 33*  BUN 8 10 17   CREATININE 0.60 0.71 0.77  CALCIUM 9.7 9.4 9.0  GLUCOSE 103* 106* 108*    CBG (last 3)  No results for input(s): GLUCAP in the last 72 hours.  Scheduled Meds: . antiseptic oral rinse  7 mL Mouth Rinse BID  . aspirin EC  81 mg Oral Daily  . furosemide  20 mg Oral BID  . guaiFENesin  600 mg Oral BID  . ipratropium  0.5 mg Nebulization BID  . isosorbide mononitrate  15 mg Oral Daily  . metoprolol tartrate  25 mg Oral BID  . mometasone-formoterol  1 puff Inhalation BID  . oxymetazoline  1 spray Each Nare BID  . pantoprazole  40 mg Oral Daily  . rosuvastatin  20 mg Oral QHS  . sodium chloride  3 mL Intravenous Q12H    Continuous Infusions: . heparin 300 Units/hr (12/07/13 1809)     Joelene Millin  Kenton Kingfisher, [place], [place], [place] Pager [phone] After Hours Pager [phone]

## 2013-12-08 NOTE — Progress Notes (Signed)
NP notified of pt having h/a along with eye pain. Pt states the pain feels like when she has a sinus infection. Pt also c/o of a nosebleed. New orders given. Will continue to monitor the pt. Hoover Brunette, RN

## 2013-12-08 NOTE — Progress Notes (Addendum)
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 [facility]   Assessment/Plan: NSTEMI  no further chest pain  ruled in for MI with elevated troponin. On heparin drip. continue ASA, statin and BB. Refuses cardiac cath as reportedly she had complications including persistent rt hand numbness following radial  artery cath and ? MI on the table. . 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 ( follows with Dr Posey Pronto at [facility]). Plan on aggressive medical management. 2 D ECHO with ef 50-55% , no WMA  Acute on chr diastolic CHF Mild exacerbation on admission. Resolved. continue oral lasix   COPD Stable .  continue o2 via [place] and prn nebs  Sinus headache  add Afrin nasal spray.  No maxillary tenderness. Prn tramadol and vicodin for pain  malnutrition  has low BMI. Dietician consulted.  DVT prophylaxis: IV heparin  Diet: cardiac   Code Status: partial code Family Communication: none at bedside Disposition Plan: home possibly in 1-2 days.   Consultants:  cardiology  Procedures:  2D echo  Antibiotics:  none  HPI/Subjective: Reports left sided sinus headache. No further chest pain or SOB  Objective: Filed Vitals:   12/08/13 0750  BP: 122/66  Pulse:   Temp:   Resp: 22    Intake/Output Summary (Last 24 hours) at 12/08/13 0813 Last data filed at 12/08/13 0224  Gross per 24 hour  Intake    200 ml  Output    650 ml  Net   -450 ml   Filed Weights   12/06/13 2212 12/07/13 0500 12/08/13 0216  Weight: 29.983 kg (66 lb 1.6 oz) 29.937 kg (66 lb) 29.393 kg (64 lb 12.8 oz)    Exam:   General:  Thin built  female in NAD  HEENT: moist mucosa   chest: absent breath sounds on left lung  CVS: NS1&S2, no murmurs  Abd: soft, NT, ND, BS+  Ext: warm. No edema      Data Reviewed: Basic Metabolic Panel:  Recent Labs Lab 12/05/13 1930 12/06/13 0612 12/08/13 0445  NA 142 140 134*  K 4.2 4.6 4.0  CL 99 98 93*  CO2 32 33* 33*  GLUCOSE 103* 106* 108*  BUN 8 10 17   CREATININE 0.60 0.71 0.77  CALCIUM 9.7 9.4 9.0   Liver Function Tests: No results for input(s): AST, ALT, ALKPHOS, BILITOT, PROT, ALBUMIN in the last 168 hours. No results for input(s): LIPASE, AMYLASE in the last 168 hours. No results for input(s): AMMONIA in the last 168 hours. CBC:  Recent Labs Lab 12/05/13 1930 12/08/13 0445  WBC 3.9* 4.0  NEUTROABS 1.7  --   HGB 13.0 11.9*  HCT 40.0 37.6  MCV 91.3 90.0  PLT 293 252   Cardiac Enzymes:  Recent Labs Lab 12/05/13 1930 12/06/13 0049 12/06/13 0613 12/06/13 1141 12/06/13 1803  TROPONINI <0.30 <0.30 <0.30 0.50* 0.61*   BNP (last 3 results)  Recent Labs  04/03/13 2023 06/26/13 1445 12/06/13 2014  PROBNP 29.9 41.9 36.7   CBG: No results for input(s): GLUCAP in the last 168 hours.  Recent Results (from the past 240 hour(s))  MRSA PCR Screening  Status: None   Collection Time: 12/06/13 10:36 PM  Result Value Ref Range Status   MRSA by PCR NEGATIVE NEGATIVE Final    Comment:        The GeneXpert MRSA Assay (FDA approved for NASAL specimens only), is one component of a comprehensive MRSA colonization surveillance program. It is not intended to diagnose MRSA infection nor to guide or monitor treatment for MRSA infections.      Studies: No results found.  Scheduled Meds: . antiseptic oral rinse  7 mL Mouth Rinse BID  . aspirin EC  81 mg Oral Daily  . furosemide  20 mg Oral BID  . guaiFENesin  600 mg Oral BID  . ipratropium  0.5 mg Nebulization BID  . metoprolol tartrate  25 mg Oral BID  . mometasone-formoterol  1 puff Inhalation  BID  . pantoprazole  40 mg Oral Daily  . rosuvastatin  20 mg Oral QHS  . sodium chloride  3 mL Intravenous Q12H   Continuous Infusions: . heparin 300 Units/hr (12/07/13 1809)      Time spent: 25 minutes    Rachael Bradley, [place]  [facility] Pager [phone] If 7PM-7AM, please contact night-coverage at [email or web address], password [facility] 12/08/2013, 8:13 AM  LOS: 3 days

## 2013-12-08 NOTE — Discharge Summary (Addendum)
Physician Discharge Summary  Rachael Bradley CZY:[id number] DOB: [birth year] DOA: 12/05/2013  PCP: Normand Sloop, MD  Admit date: 12/05/2013 Discharge date: 12/08/2013  Time spent: 35 minutes  Recommendations for Outpatient Follow-up:  1. Discharge home with outpatient PCP and cardiology follow-up  Discharge Diagnoses:  Principal problem: Acute on chronic diastolic CHF  Active Problems:   NSTEMI (non-ST elevated myocardial infarction)   Chest pain   CAD (coronary artery disease) of artery bypass graft   Lung cancer   Acute sinusitis   Coronary arteriosclerosis in native artery   COPD with chronic respiratory failure   Discharge Condition: Fair  Diet recommendation: Cardiac  CODE STATUS: DO NOT INTUBATE but okay with cardiac resuscitation  Filed Weights   12/06/13 2212 12/07/13 0500 12/08/13 0216  Weight: 29.983 kg (66 lb 1.6 oz) 29.937 kg (66 lb) 29.393 kg (64 lb 12.8 oz)    History of present illness:  Please refer to admission H&P for details, but in brief,73 y/o female with hx of MI s/p stenting, COPD with chronic resp failure on home o2 , complete left lung lobectomy for cancer , HTN, GERD and PAD admitted for chest pain with NSTEMI. [facility]  Hospital Course:  NSTEMI no further chest pain since admission. ruled in for MI with elevated troponin. Placed  On heparin drip. continued ASA, statin and BB. -Patient refused for cardiac cath as reportedly she had complications including persistent rt hand numbness following radial artery cath and ? MI on the table. . 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 ( follows with Dr Posey Pronto at [facility]). Plan on aggressive medical management. Patient will be discharged on her home dose of aspirin and statin. Added metoprolol and Imdur. Continue when necessary sublingual nitroglycerin for chest pain. - 2 D ECHO with ef 50-55% and grade  1 diastolic function with no wall motion abnormality. -Patient will follow-up with her cardiologist Dr. Posey Pronto at [facility] (appointment scheduled for next week)  Acute on diastolic CHF Mild exacerbation on admission. Resolved. Discharged on oral Lasix. Continue beta blocker and Imdur. Instructed on dietary and medication adherence.  COPD Stable . continue o2 via [place] and home inhalers and nebs.   Sinus headache added Afrin nasal spray. No maxillary tenderness. Prn tramadol for pain.   Severe protein calorie malnutrition has low BMI. Dietician consulted while in hospital . No further recommendations     Diet: cardiac   Code Status: partial code Family Communication: none at bedside Disposition Plan: home possibly in 1-2 days.   Consultants:  cardiology  Procedures:  2D echo  Antibiotics:  none  Discharge Exam: Filed Vitals:   12/08/13 1050  BP: 117/60  Pulse:   Temp:   Resp: 20     General: Thin built female in NAD  HEENT: moist mucosa  chest: absent breath sounds on left lung  CVS: NS1&S2, no murmurs  Abd: soft, NT, ND, BS+  Ext: warm. No edema  CNS: Alert and oriented   Discharge Instructions You were cared for by a hospitalist during your hospital stay. If you have any questions about your discharge medications or the care you received while you were in the hospital after you are discharged, you can call the unit and asked to speak with the hospitalist on call if the hospitalist that took care of you is not available. Once you are discharged, your primary care physician will handle any further medical issues.  Please note that NO REFILLS for any discharge medications will be authorized once you are discharged, as it is imperative that you return to your primary care physician (or establish a relationship with a primary care physician if you do not have one) for your aftercare needs so that they can reassess your need for medications and monitor your lab  values.   Current Discharge Medication List    START taking these medications   Details  furosemide (LASIX) 20 MG tablet Take 1 tablet (20 mg total) by mouth 2 (two) times daily. Qty: 60 tablet, Refills: 0    guaiFENesin (MUCINEX) 600 MG 12 hr tablet Take 1 tablet (600 mg total) by mouth 2 (two) times daily. Qty: 10 tablet, Refills: 0    isosorbide mononitrate (IMDUR) 15 mg TB24 24 hr tablet Take 0.5 tablets (15 mg total) by mouth daily. Qty: 30 tablet, Refills: 0    metoprolol tartrate (LOPRESSOR) 25 MG tablet Take 1 tablet (25 mg total) by mouth 2 (two) times daily. Qty: 60 tablet, Refills: 0    oxymetazoline (AFRIN) 0.05 % nasal spray Place 1 spray into both nostrils 2 (two) times daily. Qty: 30 mL, Refills: 0    traMADol (ULTRAM) 50 MG tablet Take 1 tablet (50 mg total) by mouth every 6 (six) hours as needed for moderate pain (HA). Qty: 30 tablet, Refills: 0      CONTINUE these medications which have NOT CHANGED   Details  albuterol (PROVENTIL HFA) 108 (90 BASE) MCG/ACT inhaler Inhale 2 puffs into the lungs every 6 (six) hours as needed for wheezing or shortness of breath.    amLODipine (NORVASC) 5 MG tablet Take 5 mg by mouth daily.    aspirin EC 81 MG tablet Take 81 mg by mouth daily.    budesonide-formoterol (SYMBICORT) 160-4.5 MCG/ACT inhaler Inhale 2 puffs into the lungs 2 (two) times daily. Qty: 1 Inhaler, Refills: 12    ipratropium (ATROVENT) 0.02 % nebulizer solution Take 2.5 mLs (0.5 mg total) by nebulization every 6 (six) hours. Qty: 75 mL, Refills: 12    nitroGLYCERIN (NITROSTAT) 0.4 MG SL tablet Place 0.4 mg under the tongue every 5 (five) minutes as needed for chest pain.    omeprazole (PRILOSEC OTC) 20 MG tablet Take 20 mg by mouth daily.    rosuvastatin (CRESTOR) 20 MG tablet Take 20 mg by mouth at bedtime.       Allergies  Allergen Reactions  . Ambien [Zolpidem]     Severe confusion/disorientation   . Penicillins Hives and Other (See Comments)     Edema   Follow-up Information    Follow up with [facility], MD On 12/14/2013.   Specialty:  Internal Medicine   Why:  @ 1:30am. You have an appointment with the nurse practitioner Orson Aloe at your normal cardiologist's office.       Follow up with Normand Sloop, MD. Schedule an appointment as soon as possible for a visit in 1 week.   Specialty:  Family Medicine   Contact information:   [place] [address] [phone]        The results of significant diagnostics from this hospitalization (including imaging, microbiology, ancillary and laboratory) are listed below for reference.    Significant Diagnostic Studies: Dg Chest Port 1 View  12/05/2013   CLINICAL DATA:  Chest pain with radiation into the neck for 3 days. Progressively worsening symptoms.  EXAM: PORTABLE CHEST - 1 VIEW  COMPARISON:  06/26/2013.  FINDINGS: Cardiopericardial silhouette within  normal limits and unchanged in size. Aortic arch atherosclerosis. Emphysema. Chronic blunting of the costophrenic angles. Lung staples are present at the LEFT apex. Monitoring leads project over the chest. Compared to the prior exam, there appears to be interstitial pulmonary edema with visible interlobular septal markings at the bases. The appearance is compatible with mild CHF and interstitial pulmonary edema. Chronic bronchitic changes.  IMPRESSION: 1. Interstitial pulmonary edema compatible with mild CHF. 2. Severe emphysema. 3. LEFT apical lung resection.   Electronically Signed   By: Dereck Ligas M.D.   On: 12/05/2013 20:24    Microbiology: Recent Results (from the past 240 hour(s))  MRSA PCR Screening     Status: None   Collection Time: 12/06/13 10:36 PM  Result Value Ref Range Status   MRSA by PCR NEGATIVE NEGATIVE Final    Comment:        The GeneXpert MRSA Assay (FDA approved for NASAL specimens only), is one component of a comprehensive MRSA colonization surveillance program. It is  not intended to diagnose MRSA infection nor to guide or monitor treatment for MRSA infections.      Labs: Basic Metabolic Panel:  Recent Labs Lab 12/05/13 1930 12/06/13 0612 12/08/13 0445  NA 142 140 134*  K 4.2 4.6 4.0  CL 99 98 93*  CO2 32 33* 33*  GLUCOSE 103* 106* 108*  BUN 8 10 17   CREATININE 0.60 0.71 0.77  CALCIUM 9.7 9.4 9.0   Liver Function Tests: No results for input(s): AST, ALT, ALKPHOS, BILITOT, PROT, ALBUMIN in the last 168 hours. No results for input(s): LIPASE, AMYLASE in the last 168 hours. No results for input(s): AMMONIA in the last 168 hours. CBC:  Recent Labs Lab 12/05/13 1930 12/08/13 0445  WBC 3.9* 4.0  NEUTROABS 1.7  --   HGB 13.0 11.9*  HCT 40.0 37.6  MCV 91.3 90.0  PLT 293 252   Cardiac Enzymes:  Recent Labs Lab 12/06/13 0049 12/06/13 0613 12/06/13 1141 12/06/13 1803 12/08/13 1107  TROPONINI <0.30 <0.30 0.50* 0.61* <0.30   BNP: BNP (last 3 results)  Recent Labs  04/03/13 2023 06/26/13 1445 12/06/13 2014  PROBNP 29.9 41.9 36.7   CBG: No results for input(s): GLUCAP in the last 168 hours.     SignedLouellen Molder  [facility] 12/08/2013, 2:04 PM

## 2013-12-08 NOTE — Progress Notes (Signed)
Patient Name: Rachael Bradley Date of Encounter: 12/08/2013     Active Problems:   Chest pain   CAD (coronary artery disease) of artery bypass graft   Lung cancer   Congestive heart failure, acute   Pain in the chest    SUBJECTIVE  No more chest pain and still does not want a cardiac cath. Feeling better adn ready to go home. Will follow up with Dr. Posey Pronto. She is from [place]  And her daughter will not be able to come by until later in the afternoon after work.   CURRENT MEDS . antiseptic oral rinse  7 mL Mouth Rinse BID  . aspirin EC  81 mg Oral Daily  . furosemide  20 mg Oral BID  . guaiFENesin  600 mg Oral BID  . ipratropium  0.5 mg Nebulization BID  . metoprolol tartrate  25 mg Oral BID  . mometasone-formoterol  1 puff Inhalation BID  . pantoprazole  40 mg Oral Daily  . rosuvastatin  20 mg Oral QHS  . sodium chloride  3 mL Intravenous Q12H    OBJECTIVE  Filed Vitals:   12/07/13 2227 12/08/13 0000 12/08/13 0216 12/08/13 0418  BP: 110/57 119/56  124/65  Pulse: 92 74  80  Temp:    98.3 F (36.8 C)  TempSrc:      Resp:  18  14  Height:      Weight:   64 lb 12.8 oz (29.393 kg)   SpO2:  96%  91%    Intake/Output Summary (Last 24 hours) at 12/08/13 0709 Last data filed at 12/08/13 0224  Gross per 24 hour  Intake    200 ml  Output    950 ml  Net   -750 ml   Filed Weights   12/06/13 2212 12/07/13 0500 12/08/13 0216  Weight: 66 lb 1.6 oz (29.983 kg) 66 lb (29.937 kg) 64 lb 12.8 oz (29.393 kg)    PHYSICAL EXAM  General: Pleasant, NAD. Neuro: Alert and oriented X 3. Moves all extremities spontaneously. Psych: Normal affect. HEENT:  Normal  Neck: Supple without bruits or JVD. Lungs:  Resp regular and unlabored, CTA. Heart: RRR no s3, s4, or murmurs. Abdomen: Soft, non-tender, non-distended, BS + x 4.  Extremities: No clubbing, cyanosis or edema. DP/PT/Radials 2+ and equal bilaterally.  Accessory Clinical Findings  CBC  Recent Labs  12/05/13 1930  12/08/13 0445  WBC 3.9* 4.0  NEUTROABS 1.7  --   HGB 13.0 11.9*  HCT 40.0 37.6  MCV 91.3 90.0  PLT 293 875   Basic Metabolic Panel  Recent Labs  12/06/13 0612 12/08/13 0445  NA 140 134*  K 4.6 4.0  CL 98 93*  CO2 33* 33*  GLUCOSE 106* 108*  BUN 10 17  CREATININE 0.71 0.77  CALCIUM 9.4 9.0   Cardiac Enzymes  Recent Labs  12/06/13 0613 12/06/13 1141 12/06/13 1803  TROPONINI <0.30 0.50* 0.61*   Fasting Lipid Panel  Recent Labs  12/07/13 0356  CHOL 180  HDL 87  LDLCALC 79  TRIG 68  CHOLHDL 2.1    TELE  NSR   Radiology/Studies  Dg Chest Port 1 View  12/05/2013   CLINICAL DATA:  Chest pain with radiation into the neck for 3 days. Progressively worsening symptoms.  EXAM: PORTABLE CHEST - 1 VIEW  COMPARISON:  06/26/2013.  FINDINGS: Cardiopericardial silhouette within normal limits and unchanged in size. Aortic arch atherosclerosis. Emphysema. Chronic blunting of the costophrenic angles. Lung staples are present at  the LEFT apex. Monitoring leads project over the chest. Compared to the prior exam, there appears to be interstitial pulmonary edema with visible interlobular septal markings at the bases. The appearance is compatible with mild CHF and interstitial pulmonary edema. Chronic bronchitic changes.  IMPRESSION: 1. Interstitial pulmonary edema compatible with mild CHF. 2. Severe emphysema. 3. LEFT apical lung resection.   Electronically Signed   By: Dereck Ligas M.D.   On: 12/05/2013 20:24    2D ECHO: 12/06/2013 LV EF: 50% -  55% Study Conclusions - Left ventricle: The cavity size was normal. Wall thickness wasnormal. Systolic function was normal. The estimated ejectionfraction was in the range of 50% to 55%. Wall motion was normal; there were no regional wall motion abnormalities. Doppler parameters are consistent with abnormal left ventricular relaxation (grade 1 diastolic dysfunction). - Aortic valve: Probably trileaflet; mildly thickened  leaflets. There was mild regurgitation. Mean gradient (S): 3 mm Hg. - Right atrium: Central venous pressure (est): 3 mm Hg. - Tricuspid valve: There was mild regurgitation. - Pulmonary arteries: PA peak pressure: 39 mm Hg (S). - Pericardium, extracardiac: There was no pericardial effusion. Impressions: - Normal LV wall thickness with LVEF 35-32%, grade 1 diastolic dysfunction. Sclerotic aortic valve with mild aortic regurgitation. Mild tricuspid regurgitation with PASP 39 mmHg.  ASSESSMENT AND PLAN  66F with CAD s/p multiple PCIs, PAD, COPD on 2L home oxygen, lung cancer s/p left lung pneumonectomy and R lung lobectomy who was transferred from [facility] yesterday for NSTEMI.  NSTEMI- -- <0.30--> 0.50 --> 0.61. -- ECHO at [facility] with normal EF and no WMA -- Ms. Adams apparently had 5 stents between 2010 and 2012 -- Dr Irish Lack personally reviewed the records from [place].  It appears that she had nonobstructive CAD in July 2013. She had patent stents at that time. She reports that during her prior catheterizations in [place], she has had some complications. She had a significant groin bleed requiring transfusion. She has had persistent right hand pain and numbness after a right radial artery cath done there as well. -- Since it has been almost 2.5 years since her last cath and she has ruled in for a MI, a repeat cardiac cath was recommended. However, after a long discussion, she ultimately decided against proceeding with this study and has opted to persue medical therapy and plans to follow-up with her cardiologist at [facility]. -- Continue ASA, statin and BB  HLD- lipids well controlled on Crestor  Acute on chronic diastolic CHF- mild exacerbation on admission and now resolved. -- ECHO at [facility] with normal EF and G1DD, mild AR, mild TR and PASP 39 -- She was given some IV Lasix and is now being continued on Lasix 20mg  BID -- No s/s volume overload.   Sinus HA- asking for something to help her  pain. Tylenol not helping.  -- Continue decongestant and something a little stronger for pain  COPD- continue home meds -- Per IM  Signed, Eileen Stanford PA-C  Pager [phone]  I have examined the patient and reviewed assessment and plan and discussed with patient.  Agree with above as stated.  Patient decided on medical therapy yesterday.  Will add Imdur 15 mg daily.  No CP currently.  Appears euvolemic.  She will f/u with Dr. Dionne Ano from [place]. Unclear whether low level troponin is from diastolic heart failure vs. True NSTEMI.   Given normal LV function, would lean towards acute on chronic diastolic heart failure.   Lanai Conlee S.

## 2013-12-08 NOTE — Discharge Instructions (Signed)

## 2013-12-11 NOTE — Plan of Care (Signed)
Problem: Discharge Progression Outcomes Goal: No anginal pain Outcome: Completed/Met Date Met:  12/08/13 Goal: Hemodynamically stable Outcome: Completed/Met Date Met:  73/71/06 Goal: Complications resolved/controlled Outcome: Completed/Met Date Met:  12/08/13 Goal: Barriers To Progression Addressed/Resolved Outcome: Completed/Met Date Met:  12/08/13 Goal: Discharge plan in place and appropriate Outcome: Completed/Met Date Met:  12/08/13 Goal: Vascular site scale level 0 - I Vascular Site Scale Level 0: No bruising/bleeding/hematoma Level I (Mild): Bruising/Ecchymosis, minimal bleeding/ooozing, palpable hematoma < 3 cm Level II (Moderate): Bleeding not affecting hemodynamic parameters, pseudoaneurysm, palpable hematoma > 3 cm Level III (Severe) Bleeding which affects hemodynamic parameters or retroperitoneal hemorrhage  Outcome: Completed/Met Date Met:  12/08/13 Goal: Tolerates diet Outcome: Completed/Met Date Met:  12/08/13 Goal: Activity appropriate for discharge plan Outcome: Completed/Met Date Met:  12/08/13

## 2014-01-03 ENCOUNTER — Other Ambulatory Visit: Payer: Self-pay

## 2014-01-03 ENCOUNTER — Emergency Department (HOSPITAL_COMMUNITY)
Admission: EM | Admit: 2014-01-03 | Discharge: 2014-01-04 | Disposition: A | Discharge status: Other Acute Inpatient Hospital | Payer: MEDICARE | Attending: Emergency Medicine | Admitting: Emergency Medicine

## 2014-01-03 ENCOUNTER — Emergency Department (HOSPITAL_COMMUNITY): Payer: MEDICARE

## 2014-01-03 ENCOUNTER — Encounter (HOSPITAL_COMMUNITY): Payer: Self-pay | Admitting: Emergency Medicine

## 2014-01-03 DIAGNOSIS — Z79899 Other long term (current) drug therapy: Secondary | ICD-10-CM | POA: Insufficient documentation

## 2014-01-03 DIAGNOSIS — K219 Gastro-esophageal reflux disease without esophagitis: Secondary | ICD-10-CM | POA: Diagnosis not present

## 2014-01-03 DIAGNOSIS — Z7982 Long term (current) use of aspirin: Secondary | ICD-10-CM | POA: Diagnosis not present

## 2014-01-03 DIAGNOSIS — Z85118 Personal history of other malignant neoplasm of bronchus and lung: Secondary | ICD-10-CM | POA: Insufficient documentation

## 2014-01-03 DIAGNOSIS — Z87891 Personal history of nicotine dependence: Secondary | ICD-10-CM | POA: Insufficient documentation

## 2014-01-03 DIAGNOSIS — Z9981 Dependence on supplemental oxygen: Secondary | ICD-10-CM | POA: Diagnosis not present

## 2014-01-03 DIAGNOSIS — Z86010 Personal history of colonic polyps: Secondary | ICD-10-CM | POA: Insufficient documentation

## 2014-01-03 DIAGNOSIS — Z880 Allergy status to penicillin: Secondary | ICD-10-CM | POA: Diagnosis not present

## 2014-01-03 DIAGNOSIS — I252 Old myocardial infarction: Secondary | ICD-10-CM | POA: Insufficient documentation

## 2014-01-03 DIAGNOSIS — I509 Heart failure, unspecified: Secondary | ICD-10-CM | POA: Insufficient documentation

## 2014-01-03 DIAGNOSIS — R079 Chest pain, unspecified: Secondary | ICD-10-CM | POA: Diagnosis present

## 2014-01-03 DIAGNOSIS — J449 Chronic obstructive pulmonary disease, unspecified: Secondary | ICD-10-CM | POA: Diagnosis not present

## 2014-01-03 LAB — CBC WITH DIFFERENTIAL/PLATELET
Basophils Absolute: 0 10*3/uL (ref 0.0–0.1)
Basophils Relative: 0 % (ref 0–1)
EOS ABS: 0.3 10*3/uL (ref 0.0–0.7)
Eosinophils Relative: 4 % (ref 0–5)
HEMATOCRIT: 40.8 % (ref 36.0–46.0)
Hemoglobin: 13.3 g/dL (ref 12.0–15.0)
LYMPHS PCT: 36 % (ref 12–46)
Lymphs Abs: 2.2 10*3/uL (ref 0.7–4.0)
MCH: 29.9 pg (ref 26.0–34.0)
MCHC: 32.6 g/dL (ref 30.0–36.0)
MCV: 91.7 fL (ref 78.0–100.0)
MONO ABS: 0.4 10*3/uL (ref 0.1–1.0)
Monocytes Relative: 7 % (ref 3–12)
Neutro Abs: 3.1 10*3/uL (ref 1.7–7.7)
Neutrophils Relative %: 53 % (ref 43–77)
Platelets: 269 10*3/uL (ref 150–400)
RBC: 4.45 MIL/uL (ref 3.87–5.11)
RDW: 13.6 % (ref 11.5–15.5)
WBC: 5.9 10*3/uL (ref 4.0–10.5)

## 2014-01-03 LAB — I-STAT TROPONIN, ED
TROPONIN I, POC: 0.01 ng/mL (ref 0.00–0.08)
Troponin i, poc: 0.01 ng/mL (ref 0.00–0.08)

## 2014-01-03 LAB — COMPREHENSIVE METABOLIC PANEL
ALBUMIN: 4.3 g/dL (ref 3.5–5.2)
ALK PHOS: 84 U/L (ref 39–117)
ALT: 17 U/L (ref 0–35)
AST: 21 U/L (ref 0–37)
Anion gap: 12 (ref 5–15)
BUN: 11 mg/dL (ref 6–23)
CHLORIDE: 99 meq/L (ref 96–112)
CO2: 32 meq/L (ref 19–32)
CREATININE: 0.73 mg/dL (ref 0.50–1.10)
Calcium: 9.9 mg/dL (ref 8.4–10.5)
GFR calc Af Amer: >90 mL/min (ref >90)
GFR calc non Af Amer: 84 mL/min — ABNORMAL LOW (ref >90)
Glucose, Bld: 110 mg/dL — ABNORMAL HIGH (ref 70–99)
POTASSIUM: 3.6 meq/L — AB (ref 3.7–5.3)
SODIUM: 143 meq/L (ref 137–147)
Total Bilirubin: 0.9 mg/dL (ref 0.3–1.2)
Total Protein: 7.7 g/dL (ref 6.0–8.3)

## 2014-01-03 LAB — PRO B NATRIURETIC PEPTIDE: Pro B Natriuretic peptide (BNP): 88.4 pg/mL (ref 0–125)

## 2014-01-03 MED ORDER — ASPIRIN 81 MG PO CHEW
324.0000 mg | CHEWABLE_TABLET | Freq: Once | ORAL | Status: AC
  Administered 2014-01-03: 324 mg via ORAL
  Filled 2014-01-03: qty 4

## 2014-01-03 MED ORDER — ACETAMINOPHEN 325 MG PO TABS
ORAL_TABLET | ORAL | Status: AC
  Administered 2014-01-03: 650 mg via ORAL
  Filled 2014-01-03: qty 2

## 2014-01-03 MED ORDER — NITROGLYCERIN 0.4 MG SL SUBL
0.4000 mg | SUBLINGUAL_TABLET | SUBLINGUAL | Status: DC | PRN
  Administered 2014-01-03 (×2): 0.4 mg via SUBLINGUAL
  Filled 2014-01-03: qty 1

## 2014-01-03 MED ORDER — ASPIRIN 81 MG PO CHEW
CHEWABLE_TABLET | ORAL | Status: AC
  Filled 2014-01-03: qty 1

## 2014-01-03 MED ORDER — ACETAMINOPHEN 325 MG PO TABS
650.0000 mg | ORAL_TABLET | Freq: Once | ORAL | Status: AC
  Administered 2014-01-03: 650 mg via ORAL

## 2014-01-03 MED ORDER — MORPHINE SULFATE 2 MG/ML IJ SOLN
2.0000 mg | INTRAMUSCULAR | Status: DC | PRN
  Administered 2014-01-03: 2 mg via INTRAVENOUS
  Filled 2014-01-03: qty 1

## 2014-01-03 NOTE — Consult Note (Addendum)
Hospitalist Consult Note    Patient name: Rachael Bradley Medical record number: [id number] Date of birth: [birth year] Age: 73 y.o. Gender: female  Primary Care Provider: Normand Sloop, MD  Chief Complaint: Chest Pain  History of Present Illness:This is a 73 y.o. year old female with significant past medical history of COPD, chronic resp failure on 2L, lung cancer s/p lobectomy, coronary artery disease status post stenting, grade 1 diastolic dysfunction presenting with chest pain. Patient reports 1-2 days of central chest pain similar to previous episodes. Has been taking nitroglycerin at home with minimal improvement in symptoms. Denies any shortness of breath currently. Patient noted to have been admitted November 15 through November 18 for an NSTEMI. Was placed on a heparin drip, statin, aspirin and beta blocker. Patient refuses cardiac catheterization. Cardiologist is Dr. Posey Pronto at [facility]. Patient was medically managed with planned follow-up with Dr. Posey Pronto at [facility] within 1 week of hospital discharge. Patient reports that she did not follow with Dr. Posey Pronto as scheduled. Presented to the ER temperature 90.9, heart rate in the 80s, respirations and intense 20s, blood pressure in the 140s to 160s, satting greater than 96% on room air. White blood cell count 5.9, hemoglobin 13.3, creatinine 0.73. Chest x-ray within normal limits. EKG normal sinus rhythm per report. Troponin negative 1. Has received full dose aspirin as well as IV morphine and nitroglycerin with minimal to mild improvement in symptoms.  Assessment and Plan: [name] is a 73 y.o. year old female presenting with chest pain.  Active Problems:   * No active hospital problems. *   Chest Pain  -Fairly typical symptoms in patient with known prior cardiac disease. EKG and troponin within normal limits. Noted recent admission within one month for NSTEMI. Had a relatively lengthy discussion with patient as well as family at the bedside.  Patient does desire to go to [facility] for further evaluation. Discussed overall plan of care and coordination with Dr. Thurnell Garbe in the ER. We'll set up transfer to [facility] for further evaluation. Continue ACS protocol in the interim.    Patient Active Problem List   Diagnosis Date Noted  . Acute sinusitis 12/08/2013  . NSTEMI (non-ST elevated myocardial infarction) 12/08/2013  . Acute exacerbation of chronic obstructive pulmonary disease (COPD) 12/08/2013  . Acute on chronic diastolic congestive heart failure   . Coronary arteriosclerosis in native artery   . COPD with acute exacerbation 04/03/2013  . COPD exacerbation 04/03/2013  . Chest pain 04/03/2013  . CAD (coronary artery disease) of artery bypass graft 04/03/2013  . Lung cancer 04/03/2013   Past Medical History: Past Medical History  Diagnosis Date  . CHF (congestive heart failure)   . COPD (chronic obstructive pulmonary disease)   . Coronary artery disease   . Acid reflux   . Hypertension   . Lung cancer   . MI (myocardial infarction)   . Peripheral arterial disease   . Colon polyps   . Oxygen dependent     2 lpm via [place],     Past Surgical History: Past Surgical History  Procedure Laterality Date  . Tubal ligation    .  stent    . Cholecystectomy    . Tonsillectomy    . Lobectomy    . Cardiac catheterization      with stents,   . Lung lobectomy      entire left lung,     Social History: History   Social History  . Marital Status: Married    Spouse Name:  N/A    Number of Children: N/A  . Years of Education: N/A   Social History Main Topics  . Smoking status: Former Research scientist (life sciences)  . Smokeless tobacco: None  . Alcohol Use: No  . Drug Use: No  . Sexual Activity: None   Other Topics Concern  . None   Social History Narrative    Family History: Family History  Problem Relation Age of Onset  . Stroke Mother   . Hypertension Mother   . Hypertension Father   . Cancer Sister   . Heart failure Sister   .  Hypertension Sister     Allergies: Allergies  Allergen Reactions  . Ambien [Zolpidem]     Severe confusion/disorientation   . Penicillins Hives and Other (See Comments)    Edema    Current Facility-Administered Medications  Medication Dose Route Frequency Provider Last Rate Last Dose  . morphine 2 MG/ML injection 2 mg  2 mg Intravenous Q1H PRN Francine Graven, DO   2 mg at 01/03/14 1949  . nitroGLYCERIN (NITROSTAT) SL tablet 0.4 mg  0.4 mg Sublingual Q5 min PRN Francine Graven, DO   0.4 mg at 01/03/14 2034   Current Outpatient Prescriptions  Medication Sig Dispense Refill  . albuterol (PROVENTIL HFA) 108 (90 BASE) MCG/ACT inhaler Inhale 2 puffs into the lungs every 6 (six) hours as needed for wheezing or shortness of breath.    [name] alum & mag hydroxide-simeth (MAALOX/MYLANTA) 200-200-20 MG/5ML suspension Take 30 mLs by mouth every 6 (six) hours as needed for indigestion or heartburn (dyspepsia). 355 mL 0  . amLODipine (NORVASC) 5 MG tablet Take 5 mg by mouth daily.    [name] aspirin EC 81 MG tablet Take 81 mg by mouth daily.    . budesonide-formoterol (SYMBICORT) 160-4.5 MCG/ACT inhaler Inhale 2 puffs into the lungs 2 (two) times daily. 1 Inhaler 12  . furosemide (LASIX) 20 MG tablet Take 1 tablet (20 mg total) by mouth 2 (two) times daily. 60 tablet 0  . ipratropium (ATROVENT) 0.02 % nebulizer solution Take 2.5 mLs (0.5 mg total) by nebulization every 6 (six) hours. 75 mL 12  . isosorbide mononitrate (IMDUR) 15 mg TB24 24 hr tablet Take 0.5 tablets (15 mg total) by mouth daily. 30 tablet 0  . metoprolol tartrate (LOPRESSOR) 25 MG tablet Take 1 tablet (25 mg total) by mouth 2 (two) times daily. 60 tablet 0  . nitroGLYCERIN (NITROSTAT) 0.4 MG SL tablet Place 0.4 mg under the tongue every 5 (five) minutes as needed for chest pain.    [name] omeprazole (PRILOSEC OTC) 20 MG tablet Take 20 mg by mouth daily.    . rosuvastatin (CRESTOR) 20 MG tablet Take 20 mg by mouth at bedtime.    [name] guaiFENesin  (MUCINEX) 600 MG 12 hr tablet Take 1 tablet (600 mg total) by mouth 2 (two) times daily. (Patient not taking: Reported on 01/03/2014) 10 tablet 0  . oxymetazoline (AFRIN) 0.05 % nasal spray Place 1 spray into both nostrils 2 (two) times daily. (Patient not taking: Reported on 01/03/2014) 30 mL 0  . traMADol (ULTRAM) 50 MG tablet Take 1 tablet (50 mg total) by mouth every 6 (six) hours as needed for moderate pain (HA). (Patient not taking: Reported on 01/03/2014) 30 tablet 0   Review Of Systems: 12 point ROS negative except as noted above in HPI.  Physical Exam: Filed Vitals:   01/03/14 2000  BP: 161/97  Pulse: 83  Temp:   Resp: 12    General: alert  and cooperative HEENT: PERRLA and extra ocular movement intact Heart: S1, S2 normal, no murmur, rub or gallop, regular rate and rhythm Lungs: clear to auscultation, no wheezes or rales and unlabored breathing Abdomen: abdomen is soft without significant tenderness, masses, organomegaly or guarding Extremities: extremities normal, atraumatic, no cyanosis or edema Skin:no rashes, no ecchymoses Neurology: normal without focal findings  Labs and Imaging: Lab Results  Component Value Date/Time   NA 143 01/03/2014 06:49 PM   K 3.6* 01/03/2014 06:49 PM   CL 99 01/03/2014 06:49 PM   CO2 32 01/03/2014 06:49 PM   BUN 11 01/03/2014 06:49 PM   CREATININE 0.73 01/03/2014 06:49 PM   GLUCOSE 110* 01/03/2014 06:49 PM   Lab Results  Component Value Date   WBC 5.9 01/03/2014   HGB 13.3 01/03/2014   HCT 40.8 01/03/2014   MCV 91.7 01/03/2014   PLT 269 01/03/2014    Dg Chest 2 View  01/03/2014   CLINICAL DATA:  Chest pain for 2 day  EXAM: CHEST  2 VIEW  COMPARISON:  12/05/2013  FINDINGS: Hyperaeration. Normal heart size. Postoperative changes at the left apex. No pneumothorax. Nodular density in the left midlung zone is stable and likely related to a nipple shadows.  IMPRESSION: No active cardiopulmonary disease.   Electronically Signed   By: Maryclare Bean M.D.   On: 01/03/2014 19:51           Shanda Howells MD  Pager: [phone]

## 2014-01-03 NOTE — ED Notes (Signed)
Pt reports onset of intermittent CP yesterday with worsening of symptoms today.

## 2014-01-03 NOTE — ED Provider Notes (Signed)
CSN: [id number]     Arrival date & time 01/03/14  1823 History   First MD Initiated Contact with Patient 01/03/14 1910     Chief Complaint  Patient presents with  . Chest Pain      HPI Pt was seen at 1920. Per pt's family and pt, c/o gradual onset and worsening of multiple intermittent episodes of chest "pain" for the past 2 days. Has been associated with intermittent SOB. Pt describes the CP as "pressure" and "tightness." States the episodes are "lasting longer and longer" since yesterday, but cannot further quantify this statement. Pt has been taking her home SL ntg without improvement. Pt has recent d/c from [facility] for NSTEMI and CHF. Family is concerned regarding same. Denies palpitations, no cough, no abd pain, no N/V/D, no back pain, no fevers, no rash.    Past Medical History  Diagnosis Date  . CHF (congestive heart failure)   . COPD (chronic obstructive pulmonary disease)   . Coronary artery disease   . Acid reflux   . Hypertension   . Lung cancer   . MI (myocardial infarction)   . Peripheral arterial disease   . Colon polyps   . Oxygen dependent     2 lpm via [place],    Past Surgical History  Procedure Laterality Date  . Tubal ligation    .  stent    . Cholecystectomy    . Tonsillectomy    . Lobectomy    . Cardiac catheterization      with stents,   . Lung lobectomy      entire left lung,    Family History  Problem Relation Age of Onset  . Stroke Mother   . Hypertension Mother   . Hypertension Father   . Cancer Sister   . Heart failure Sister   . Hypertension Sister    History  Substance Use Topics  . Smoking status: Former Research scientist (life sciences)  . Smokeless tobacco: Not on file  . Alcohol Use: No   OB History    Gravida Para Term Preterm AB TAB SAB Ectopic Multiple Living   5 4 4  1  1         Review of Systems ROS: Statement: All systems negative except as marked or noted in the HPI; Constitutional: Negative for fever and chills. ; ; Eyes: Negative for eye pain,  redness and discharge. ; ; ENMT: Negative for ear pain, hoarseness, nasal congestion, sinus pressure and sore throat. ; ; Cardiovascular: +CP, SOB. Negative for palpitations, diaphoresis, and peripheral edema. ; ; Respiratory: Negative for cough, wheezing and stridor. ; ; Gastrointestinal: Negative for nausea, vomiting, diarrhea, abdominal pain, blood in stool, hematemesis, jaundice and rectal bleeding. . ; ; Genitourinary: Negative for dysuria, flank pain and hematuria. ; ; Musculoskeletal: Negative for back pain and neck pain. Negative for swelling and trauma.; ; Skin: Negative for pruritus, rash, abrasions, blisters, bruising and skin lesion.; ; Neuro: Negative for headache, lightheadedness and neck stiffness. Negative for weakness, altered level of consciousness , altered mental status, extremity weakness, paresthesias, involuntary movement, seizure and syncope.      Allergies  Ambien and Penicillins  Home Medications   Prior to Admission medications   Medication Sig Start Date End Date Taking? Authorizing Provider  albuterol (PROVENTIL HFA) 108 (90 BASE) MCG/ACT inhaler Inhale 2 puffs into the lungs every 6 (six) hours as needed for wheezing or shortness of breath.   Yes Historical Provider, MD  alum & mag hydroxide-simeth (MAALOX/MYLANTA) 200-200-20 MG/5ML  suspension Take 30 mLs by mouth every 6 (six) hours as needed for indigestion or heartburn (dyspepsia). 12/08/13  Yes Nishant Dhungel, MD  amLODipine (NORVASC) 5 MG tablet Take 5 mg by mouth daily.   Yes Historical Provider, MD  aspirin EC 81 MG tablet Take 81 mg by mouth daily.   Yes Historical Provider, MD  budesonide-formoterol (SYMBICORT) 160-4.5 MCG/ACT inhaler Inhale 2 puffs into the lungs 2 (two) times daily. 04/07/13  Yes Nita Sells, MD  furosemide (LASIX) 20 MG tablet Take 1 tablet (20 mg total) by mouth 2 (two) times daily. 12/08/13  Yes Nishant Dhungel, MD  ipratropium (ATROVENT) 0.02 % nebulizer solution Take 2.5 mLs (0.5  mg total) by nebulization every 6 (six) hours. 04/07/13  Yes Nita Sells, MD  isosorbide mononitrate (IMDUR) 15 mg TB24 24 hr tablet Take 0.5 tablets (15 mg total) by mouth daily. 12/08/13  Yes Nishant Dhungel, MD  metoprolol tartrate (LOPRESSOR) 25 MG tablet Take 1 tablet (25 mg total) by mouth 2 (two) times daily. 12/08/13  Yes Nishant Dhungel, MD  nitroGLYCERIN (NITROSTAT) 0.4 MG SL tablet Place 0.4 mg under the tongue every 5 (five) minutes as needed for chest pain.   Yes Historical Provider, MD  omeprazole (PRILOSEC OTC) 20 MG tablet Take 20 mg by mouth daily.   Yes Historical Provider, MD  rosuvastatin (CRESTOR) 20 MG tablet Take 20 mg by mouth at bedtime.   Yes Historical Provider, MD  guaiFENesin (MUCINEX) 600 MG 12 hr tablet Take 1 tablet (600 mg total) by mouth 2 (two) times daily. Patient not taking: Reported on 01/03/2014 12/08/13   Nishant Dhungel, MD  oxymetazoline (AFRIN) 0.05 % nasal spray Place 1 spray into both nostrils 2 (two) times daily. Patient not taking: Reported on 01/03/2014 12/08/13   Nishant Dhungel, MD  traMADol (ULTRAM) 50 MG tablet Take 1 tablet (50 mg total) by mouth every 6 (six) hours as needed for moderate pain (HA). Patient not taking: Reported on 01/03/2014 12/08/13   Nishant Dhungel, MD   BP 161/97 mmHg  Pulse 83  Temp(Src) 99 F (37.2 C) (Oral)  Resp 12  Ht 4\' 9"  (1.448 m)  Wt 68 lb (30.845 kg)  BMI 14.71 kg/m2  SpO2 99% Physical Exam  1925: Physical examination:  Nursing notes reviewed; Vital signs and O2 SAT reviewed;  Constitutional: Well developed, Well nourished, Well hydrated, In no acute distress; Head:  Normocephalic, atraumatic; Eyes: EOMI, PERRL, No scleral icterus; ENMT: Mouth and pharynx normal, Mucous membranes moist; Neck: Supple, Full range of motion, No lymphadenopathy; Cardiovascular: Regular rate and rhythm, No gallop; Respiratory: Breath sounds diminished & equal bilaterally, No wheezes.  Speaking full sentences with ease,  Normal respiratory effort/excursion; Chest: Nontender, Movement normal; Abdomen: Soft, Nontender, Nondistended, Normal bowel sounds; Genitourinary: No CVA tenderness; Extremities: Pulses normal, No tenderness, No edema, No calf edema or asymmetry.; Neuro: AA&Ox3, vague historian. Major CN grossly intact.  Speech clear. No gross focal motor or sensory deficits in extremities.; Skin: Color normal, Warm, Dry.    ED Course  Procedures     EKG Interpretation None      MDM  MDM Reviewed: previous chart, nursing note and vitals Reviewed previous: labs and ECG Interpretation: labs, ECG and x-ray      Date: 01/03/2014  Rate: 81  Rhythm: normal sinus rhythm and premature ventricular contractions (PVC)  QRS Axis: normal  Intervals: normal  ST/T Wave abnormalities: normal  Conduction Disutrbances:none  Narrative Interpretation:   Old EKG Reviewed: unchanged; no significant changes from  previous EKG dated 12/07/13.   Results for orders placed or performed during the hospital encounter of 01/03/14  Comprehensive metabolic panel  Result Value Ref Range   Sodium 143 137 - 147 mEq/L   Potassium 3.6 (L) 3.7 - 5.3 mEq/L   Chloride 99 96 - 112 mEq/L   CO2 32 19 - 32 mEq/L   Glucose, Bld 110 (H) 70 - 99 mg/dL   BUN 11 6 - 23 mg/dL   Creatinine, Ser 0.73 0.50 - 1.10 mg/dL   Calcium 9.9 8.4 - 10.5 mg/dL   Total Protein 7.7 6.0 - 8.3 g/dL   Albumin 4.3 3.5 - 5.2 g/dL   AST 21 0 - 37 U/L   ALT 17 0 - 35 U/L   Alkaline Phosphatase 84 39 - 117 U/L   Total Bilirubin 0.9 0.3 - 1.2 mg/dL   GFR calc non Af Amer 84 (L) >90 mL/min   GFR calc Af Amer >90 >90 mL/min   Anion gap 12 5 - 15  CBC with Differential  Result Value Ref Range   WBC 5.9 4.0 - 10.5 K/uL   RBC 4.45 3.87 - 5.11 MIL/uL   Hemoglobin 13.3 12.0 - 15.0 g/dL   HCT 40.8 36.0 - 46.0 %   MCV 91.7 78.0 - 100.0 fL   MCH 29.9 26.0 - 34.0 pg   MCHC 32.6 30.0 - 36.0 g/dL   RDW 13.6 11.5 - 15.5 %   Platelets 269 150 - 400 K/uL    Neutrophils Relative % 53 43 - 77 %   Neutro Abs 3.1 1.7 - 7.7 K/uL   Lymphocytes Relative 36 12 - 46 %   Lymphs Abs 2.2 0.7 - 4.0 K/uL   Monocytes Relative 7 3 - 12 %   Monocytes Absolute 0.4 0.1 - 1.0 K/uL   Eosinophils Relative 4 0 - 5 %   Eosinophils Absolute 0.3 0.0 - 0.7 K/uL   Basophils Relative 0 0 - 1 %   Basophils Absolute 0.0 0.0 - 0.1 K/uL  Pro b natriuretic peptide (BNP)  Result Value Ref Range   Pro B Natriuretic peptide (BNP) 88.4 0 - 125 pg/mL  I-Stat Troponin, ED (not at [facility])  Result Value Ref Range   Troponin i, poc 0.01 0.00 - 0.08 ng/mL   Comment 3           Dg Chest 2 View 01/03/2014   CLINICAL DATA:  Chest pain for 2 day  EXAM: CHEST  2 VIEW  COMPARISON:  12/05/2013  FINDINGS: Hyperaeration. Normal heart size. Postoperative changes at the left apex. No pneumothorax. Nodular density in the left midlung zone is stable and likely related to a nipple shadows.  IMPRESSION: No active cardiopulmonary disease.   Electronically Signed   By: Maryclare Bean M.D.   On: 01/03/2014 19:51     2020:  EKG without acute changes and troponin negative. Pt is confusing historian. Given ASA, SL ntg and IV morphine with improvement of her symptoms. Recent [facility] admit for NSTEMI with CHF; will admit.  T/C to [facility] Dr. Ernestina Patches, case discussed, including:  HPI, pertinent PM/SHx, VS/PE, dx testing, ED course and treatment:  Agreeable to come to ED for evaluation.   2035:  Pt now states she wants to go to [place] where her Cards MD is located (Dr. Posey Pronto) and family is in agreement. Will call [facility] for transfer.   2045:  T/C to [facility] Dr. Dionne Ano, case discussed, including:  HPI, pertinent PM/SHx, VS/PE, dx  testing, ED course and treatment:  Agreeable to accept transfer, requests to call Transfer Center to obtain attending and room assignment. T/C from Transfer Center: accepting Attending Dr. Cleophus Molt. Pt continues symptom-free while in the ED. Will transfer stable.   Francine Graven, DO 01/05/14  1630

## 2015-12-16 ENCOUNTER — Observation Stay
Admission: AD | Admit: 2015-12-16 | Payer: Self-pay | Source: Other Acute Inpatient Hospital | Admitting: Family Medicine

## 2018-03-25 ENCOUNTER — Encounter (HOSPITAL_COMMUNITY): Payer: Self-pay | Admitting: *Deleted

## 2018-03-25 ENCOUNTER — Inpatient Hospital Stay (HOSPITAL_COMMUNITY)
Admission: AD | Admit: 2018-03-25 | Discharge: 2018-03-30 | DRG: 683 | Disposition: A | Discharge status: Hospice | Payer: Medicare Other | Source: Other Acute Inpatient Hospital | Attending: Internal Medicine | Admitting: Internal Medicine

## 2018-03-25 DIAGNOSIS — M549 Dorsalgia, unspecified: Secondary | ICD-10-CM | POA: Diagnosis present

## 2018-03-25 DIAGNOSIS — K219 Gastro-esophageal reflux disease without esophagitis: Secondary | ICD-10-CM | POA: Diagnosis not present

## 2018-03-25 DIAGNOSIS — Z87891 Personal history of nicotine dependence: Secondary | ICD-10-CM

## 2018-03-25 DIAGNOSIS — J438 Other emphysema: Secondary | ICD-10-CM | POA: Diagnosis not present

## 2018-03-25 DIAGNOSIS — I5032 Chronic diastolic (congestive) heart failure: Secondary | ICD-10-CM

## 2018-03-25 DIAGNOSIS — Z9981 Dependence on supplemental oxygen: Secondary | ICD-10-CM | POA: Diagnosis not present

## 2018-03-25 DIAGNOSIS — J9611 Chronic respiratory failure with hypoxia: Secondary | ICD-10-CM | POA: Diagnosis present

## 2018-03-25 DIAGNOSIS — Z880 Allergy status to penicillin: Secondary | ICD-10-CM

## 2018-03-25 DIAGNOSIS — Z8719 Personal history of other diseases of the digestive system: Secondary | ICD-10-CM

## 2018-03-25 DIAGNOSIS — Z7982 Long term (current) use of aspirin: Secondary | ICD-10-CM

## 2018-03-25 DIAGNOSIS — R042 Hemoptysis: Secondary | ICD-10-CM | POA: Diagnosis not present

## 2018-03-25 DIAGNOSIS — J431 Panlobular emphysema: Secondary | ICD-10-CM | POA: Diagnosis not present

## 2018-03-25 DIAGNOSIS — I25810 Atherosclerosis of coronary artery bypass graft(s) without angina pectoris: Secondary | ICD-10-CM | POA: Diagnosis present

## 2018-03-25 DIAGNOSIS — Z85118 Personal history of other malignant neoplasm of bronchus and lung: Secondary | ICD-10-CM | POA: Diagnosis not present

## 2018-03-25 DIAGNOSIS — J449 Chronic obstructive pulmonary disease, unspecified: Secondary | ICD-10-CM | POA: Diagnosis present

## 2018-03-25 DIAGNOSIS — I739 Peripheral vascular disease, unspecified: Secondary | ICD-10-CM | POA: Diagnosis present

## 2018-03-25 DIAGNOSIS — J439 Emphysema, unspecified: Secondary | ICD-10-CM | POA: Diagnosis not present

## 2018-03-25 DIAGNOSIS — N1339 Other hydronephrosis: Secondary | ICD-10-CM | POA: Diagnosis not present

## 2018-03-25 DIAGNOSIS — I361 Nonrheumatic tricuspid (valve) insufficiency: Secondary | ICD-10-CM | POA: Diagnosis not present

## 2018-03-25 DIAGNOSIS — Z823 Family history of stroke: Secondary | ICD-10-CM

## 2018-03-25 DIAGNOSIS — Z66 Do not resuscitate: Secondary | ICD-10-CM | POA: Diagnosis not present

## 2018-03-25 DIAGNOSIS — N179 Acute kidney failure, unspecified: Secondary | ICD-10-CM | POA: Diagnosis present

## 2018-03-25 DIAGNOSIS — E611 Iron deficiency: Secondary | ICD-10-CM | POA: Diagnosis not present

## 2018-03-25 DIAGNOSIS — R339 Retention of urine, unspecified: Secondary | ICD-10-CM | POA: Diagnosis not present

## 2018-03-25 DIAGNOSIS — Z681 Body mass index (BMI) 19 or less, adult: Secondary | ICD-10-CM

## 2018-03-25 DIAGNOSIS — D638 Anemia in other chronic diseases classified elsewhere: Secondary | ICD-10-CM | POA: Diagnosis present

## 2018-03-25 DIAGNOSIS — I2510 Atherosclerotic heart disease of native coronary artery without angina pectoris: Secondary | ICD-10-CM | POA: Diagnosis present

## 2018-03-25 DIAGNOSIS — Z79899 Other long term (current) drug therapy: Secondary | ICD-10-CM

## 2018-03-25 DIAGNOSIS — R627 Adult failure to thrive: Secondary | ICD-10-CM | POA: Diagnosis present

## 2018-03-25 DIAGNOSIS — Z8249 Family history of ischemic heart disease and other diseases of the circulatory system: Secondary | ICD-10-CM

## 2018-03-25 DIAGNOSIS — I132 Hypertensive heart and chronic kidney disease with heart failure and with stage 5 chronic kidney disease, or end stage renal disease: Secondary | ICD-10-CM | POA: Diagnosis not present

## 2018-03-25 DIAGNOSIS — Z902 Acquired absence of lung [part of]: Secondary | ICD-10-CM

## 2018-03-25 DIAGNOSIS — Z7189 Other specified counseling: Secondary | ICD-10-CM | POA: Diagnosis not present

## 2018-03-25 DIAGNOSIS — Z7902 Long term (current) use of antithrombotics/antiplatelets: Secondary | ICD-10-CM

## 2018-03-25 DIAGNOSIS — R64 Cachexia: Secondary | ICD-10-CM | POA: Diagnosis present

## 2018-03-25 DIAGNOSIS — I25119 Atherosclerotic heart disease of native coronary artery with unspecified angina pectoris: Secondary | ICD-10-CM | POA: Diagnosis not present

## 2018-03-25 DIAGNOSIS — Z515 Encounter for palliative care: Secondary | ICD-10-CM | POA: Diagnosis present

## 2018-03-25 DIAGNOSIS — N1330 Unspecified hydronephrosis: Secondary | ICD-10-CM

## 2018-03-25 DIAGNOSIS — R0602 Shortness of breath: Secondary | ICD-10-CM | POA: Diagnosis not present

## 2018-03-25 DIAGNOSIS — R54 Age-related physical debility: Secondary | ICD-10-CM | POA: Diagnosis present

## 2018-03-25 DIAGNOSIS — I25700 Atherosclerosis of coronary artery bypass graft(s), unspecified, with unstable angina pectoris: Secondary | ICD-10-CM | POA: Diagnosis not present

## 2018-03-25 DIAGNOSIS — Z7951 Long term (current) use of inhaled steroids: Secondary | ICD-10-CM

## 2018-03-25 DIAGNOSIS — J42 Unspecified chronic bronchitis: Secondary | ICD-10-CM | POA: Diagnosis not present

## 2018-03-25 DIAGNOSIS — I351 Nonrheumatic aortic (valve) insufficiency: Secondary | ICD-10-CM | POA: Diagnosis not present

## 2018-03-25 DIAGNOSIS — Z888 Allergy status to other drugs, medicaments and biological substances status: Secondary | ICD-10-CM

## 2018-03-25 DIAGNOSIS — Z955 Presence of coronary angioplasty implant and graft: Secondary | ICD-10-CM

## 2018-03-25 DIAGNOSIS — N186 End stage renal disease: Secondary | ICD-10-CM | POA: Diagnosis not present

## 2018-03-25 DIAGNOSIS — I252 Old myocardial infarction: Secondary | ICD-10-CM

## 2018-03-26 ENCOUNTER — Other Ambulatory Visit: Payer: Self-pay

## 2018-03-26 ENCOUNTER — Inpatient Hospital Stay (HOSPITAL_COMMUNITY): Payer: Medicare Other

## 2018-03-26 ENCOUNTER — Encounter (HOSPITAL_COMMUNITY): Payer: Self-pay | Admitting: Internal Medicine

## 2018-03-26 DIAGNOSIS — J449 Chronic obstructive pulmonary disease, unspecified: Secondary | ICD-10-CM | POA: Diagnosis present

## 2018-03-26 DIAGNOSIS — I351 Nonrheumatic aortic (valve) insufficiency: Secondary | ICD-10-CM

## 2018-03-26 DIAGNOSIS — J42 Unspecified chronic bronchitis: Secondary | ICD-10-CM

## 2018-03-26 DIAGNOSIS — I361 Nonrheumatic tricuspid (valve) insufficiency: Secondary | ICD-10-CM

## 2018-03-26 DIAGNOSIS — N179 Acute kidney failure, unspecified: Secondary | ICD-10-CM | POA: Diagnosis present

## 2018-03-26 DIAGNOSIS — J438 Other emphysema: Secondary | ICD-10-CM

## 2018-03-26 LAB — BASIC METABOLIC PANEL
ANION GAP: 14 (ref 5–15)
BUN: 74 mg/dL — ABNORMAL HIGH (ref 8–23)
CHLORIDE: 101 mmol/L (ref 98–111)
CO2: 22 mmol/L (ref 22–32)
Calcium: 7.7 mg/dL — ABNORMAL LOW (ref 8.9–10.3)
Creatinine, Ser: 7.35 mg/dL — ABNORMAL HIGH (ref 0.44–1.00)
GFR calc Af Amer: 6 mL/min — ABNORMAL LOW (ref >60)
GFR calc non Af Amer: 5 mL/min — ABNORMAL LOW (ref >60)
Glucose, Bld: 125 mg/dL — ABNORMAL HIGH (ref 70–99)
Potassium: 3.8 mmol/L (ref 3.5–5.1)
Sodium: 137 mmol/L (ref 135–145)

## 2018-03-26 LAB — CBC WITH DIFFERENTIAL/PLATELET
Abs Immature Granulocytes: 0.05 10*3/uL (ref 0.00–0.07)
Basophils Absolute: 0 10*3/uL (ref 0.0–0.1)
Basophils Relative: 0 %
Eosinophils Absolute: 0 10*3/uL (ref 0.0–0.5)
Eosinophils Relative: 0 %
HCT: 31.1 % — ABNORMAL LOW (ref 36.0–46.0)
Hemoglobin: 9.5 g/dL — ABNORMAL LOW (ref 12.0–15.0)
Immature Granulocytes: 1 %
Lymphocytes Relative: 3 %
Lymphs Abs: 0.3 10*3/uL — ABNORMAL LOW (ref 0.7–4.0)
MCH: 26.5 pg (ref 26.0–34.0)
MCHC: 30.5 g/dL (ref 30.0–36.0)
MCV: 86.9 fL (ref 80.0–100.0)
MONO ABS: 0.6 10*3/uL (ref 0.1–1.0)
Monocytes Relative: 8 %
Neutro Abs: 7.1 10*3/uL (ref 1.7–7.7)
Neutrophils Relative %: 88 %
Platelets: 137 10*3/uL — ABNORMAL LOW (ref 150–400)
RBC: 3.58 MIL/uL — AB (ref 3.87–5.11)
RDW: 17.1 % — ABNORMAL HIGH (ref 11.5–15.5)
WBC: 8.1 10*3/uL (ref 4.0–10.5)
nRBC: 0 % (ref 0.0–0.2)

## 2018-03-26 LAB — URINALYSIS, ROUTINE W REFLEX MICROSCOPIC
BILIRUBIN URINE: NEGATIVE
Glucose, UA: 150 mg/dL — AB
KETONES UR: NEGATIVE mg/dL
Leukocytes,Ua: NEGATIVE
NITRITE: NEGATIVE
Protein, ur: 100 mg/dL — AB
Specific Gravity, Urine: 1.013 (ref 1.005–1.030)
pH: 7 (ref 5.0–8.0)

## 2018-03-26 LAB — RETICULOCYTES
Immature Retic Fract: 3.3 % (ref 2.3–15.9)
RBC.: 3.64 MIL/uL — ABNORMAL LOW (ref 3.87–5.11)
Retic Count, Absolute: 16.7 10*3/uL — ABNORMAL LOW (ref 19.0–186.0)
Retic Ct Pct: 0.5 % (ref 0.4–3.1)

## 2018-03-26 LAB — HEPATIC FUNCTION PANEL
ALK PHOS: 108 U/L (ref 38–126)
ALT: 26 U/L (ref 0–44)
AST: 74 U/L — ABNORMAL HIGH (ref 15–41)
Albumin: 2.5 g/dL — ABNORMAL LOW (ref 3.5–5.0)
BILIRUBIN INDIRECT: 0.3 mg/dL (ref 0.3–0.9)
Bilirubin, Direct: 0.1 mg/dL (ref 0.0–0.2)
TOTAL PROTEIN: 5.9 g/dL — AB (ref 6.5–8.1)
Total Bilirubin: 0.4 mg/dL (ref 0.3–1.2)

## 2018-03-26 LAB — IRON AND TIBC
Iron: 11 ug/dL — ABNORMAL LOW (ref 28–170)
Saturation Ratios: 4 % — ABNORMAL LOW (ref 10.4–31.8)
TIBC: 279 ug/dL (ref 250–450)
UIBC: 268 ug/dL

## 2018-03-26 LAB — TROPONIN I: Troponin I: 0.03 ng/mL (ref <0.03)

## 2018-03-26 LAB — FERRITIN: Ferritin: 199 ng/mL (ref 11–307)

## 2018-03-26 LAB — CK: Total CK: 264 U/L — ABNORMAL HIGH (ref 38–234)

## 2018-03-26 LAB — ECHOCARDIOGRAM COMPLETE: Weight: 1129.6 oz

## 2018-03-26 LAB — FOLATE: FOLATE: 11.9 ng/mL (ref >5.9)

## 2018-03-26 LAB — PHOSPHORUS: Phosphorus: 5.9 mg/dL — ABNORMAL HIGH (ref 2.5–4.6)

## 2018-03-26 LAB — BRAIN NATRIURETIC PEPTIDE: B Natriuretic Peptide: 256.5 pg/mL — ABNORMAL HIGH (ref 0.0–100.0)

## 2018-03-26 LAB — VITAMIN B12: Vitamin B-12: 2726 pg/mL — ABNORMAL HIGH (ref 180–914)

## 2018-03-26 LAB — CREATININE, URINE, RANDOM: Creatinine, Urine: 67.5 mg/dL

## 2018-03-26 LAB — D-DIMER, QUANTITATIVE: D-Dimer, Quant: 1.32 ug/mL-FEU — ABNORMAL HIGH (ref 0.00–0.50)

## 2018-03-26 LAB — SODIUM, URINE, RANDOM: Sodium, Ur: 39 mmol/L

## 2018-03-26 MED ORDER — CLOPIDOGREL BISULFATE 75 MG PO TABS
75.0000 mg | ORAL_TABLET | Freq: Every day | ORAL | Status: DC
  Administered 2018-03-27 – 2018-03-30 (×4): 75 mg via ORAL
  Filled 2018-03-26 (×4): qty 1

## 2018-03-26 MED ORDER — ALBUTEROL SULFATE (2.5 MG/3ML) 0.083% IN NEBU
3.0000 mL | INHALATION_SOLUTION | Freq: Four times a day (QID) | RESPIRATORY_TRACT | Status: DC | PRN
  Filled 2018-03-26: qty 3

## 2018-03-26 MED ORDER — SODIUM CHLORIDE 0.9 % IV SOLN
INTRAVENOUS | Status: DC
  Administered 2018-03-26: 06:00:00 via INTRAVENOUS

## 2018-03-26 MED ORDER — METOPROLOL TARTRATE 25 MG PO TABS
25.0000 mg | ORAL_TABLET | Freq: Two times a day (BID) | ORAL | Status: DC
  Administered 2018-03-26 – 2018-03-30 (×9): 25 mg via ORAL
  Filled 2018-03-26 (×9): qty 1

## 2018-03-26 MED ORDER — ACETAMINOPHEN 650 MG RE SUPP: 650.0000 mg | Freq: Four times a day (QID) | RECTAL | Status: DC | PRN

## 2018-03-26 MED ORDER — IPRATROPIUM-ALBUTEROL 0.5-2.5 (3) MG/3ML IN SOLN
3.0000 mL | RESPIRATORY_TRACT | Status: DC
  Administered 2018-03-26 – 2018-03-27 (×7): 3 mL via RESPIRATORY_TRACT
  Filled 2018-03-26 (×7): qty 3

## 2018-03-26 MED ORDER — HYDRALAZINE HCL 20 MG/ML IJ SOLN
5.0000 mg | INTRAMUSCULAR | Status: DC | PRN
  Administered 2018-03-26: 0.25 mg via INTRAVENOUS
  Filled 2018-03-26: qty 1

## 2018-03-26 MED ORDER — NITROGLYCERIN 0.4 MG SL SUBL
0.4000 mg | SUBLINGUAL_TABLET | SUBLINGUAL | Status: DC | PRN
  Administered 2018-03-30: 0.4 mg via SUBLINGUAL
  Filled 2018-03-26: qty 1

## 2018-03-26 MED ORDER — ASPIRIN EC 81 MG PO TBEC
81.0000 mg | DELAYED_RELEASE_TABLET | Freq: Every day | ORAL | Status: DC
  Administered 2018-03-26 – 2018-03-30 (×5): 81 mg via ORAL
  Filled 2018-03-26 (×5): qty 1

## 2018-03-26 MED ORDER — ISOSORBIDE MONONITRATE ER 30 MG PO TB24
30.0000 mg | ORAL_TABLET | Freq: Every day | ORAL | Status: DC
  Administered 2018-03-26 – 2018-03-30 (×5): 30 mg via ORAL
  Filled 2018-03-26 (×5): qty 1

## 2018-03-26 MED ORDER — ACETAMINOPHEN 325 MG PO TABS
650.0000 mg | ORAL_TABLET | Freq: Four times a day (QID) | ORAL | Status: DC | PRN
  Administered 2018-03-26 – 2018-03-29 (×7): 650 mg via ORAL
  Filled 2018-03-26 (×7): qty 2

## 2018-03-26 MED ORDER — MOMETASONE FURO-FORMOTEROL FUM 200-5 MCG/ACT IN AERO
2.0000 | INHALATION_SPRAY | Freq: Two times a day (BID) | RESPIRATORY_TRACT | Status: DC
  Administered 2018-03-26 – 2018-03-30 (×9): 2 via RESPIRATORY_TRACT
  Filled 2018-03-26: qty 8.8

## 2018-03-26 MED ORDER — ONDANSETRON HCL 4 MG PO TABS: 4.0000 mg | ORAL_TABLET | Freq: Four times a day (QID) | ORAL | Status: DC | PRN

## 2018-03-26 MED ORDER — SODIUM CHLORIDE 0.9 % IV SOLN
INTRAVENOUS | Status: AC
  Administered 2018-03-26 (×2): via INTRAVENOUS

## 2018-03-26 MED ORDER — ROSUVASTATIN CALCIUM 20 MG PO TABS
20.0000 mg | ORAL_TABLET | Freq: Every day | ORAL | Status: DC
  Administered 2018-03-26 – 2018-03-29 (×4): 20 mg via ORAL
  Filled 2018-03-26 (×4): qty 1

## 2018-03-26 MED ORDER — IPRATROPIUM-ALBUTEROL 0.5-2.5 (3) MG/3ML IN SOLN
3.0000 mL | RESPIRATORY_TRACT | Status: DC | PRN
  Administered 2018-03-26 – 2018-03-30 (×4): 3 mL via RESPIRATORY_TRACT
  Filled 2018-03-26 (×4): qty 3

## 2018-03-26 MED ORDER — ONDANSETRON HCL 4 MG/2ML IJ SOLN: 4.0000 mg | Freq: Four times a day (QID) | INTRAMUSCULAR | Status: DC | PRN

## 2018-03-26 NOTE — Care Management Note (Signed)
Case Management Note  Patient Details  Name: Floriene Jeschke MRN: [id number] Date of Birth: 02/21/1940  Subjective/Objective:                    Action/Plan:  Spoke w patient and family at bedside. Patient is from home w son. She has daughters at bedside who also provide support. She has home oxygen through [place]. She does not have any additional home care providers. She denies using or needing any DME at home to carry out ADLs.  CM will continue to follow.   Expected Discharge Date:                  Expected Discharge Plan:     In-House Referral:     Discharge planning Services  CM Consult  Post Acute Care Choice:    Choice offered to:     DME Arranged:    DME Agency:     HH Arranged:    HH Agency:     Status of Service:  In process, will continue to follow  If discussed at Long Length of Stay Meetings, dates discussed:    Additional Comments:  Carles Collet, RN 03/26/2018, 2:05 PM

## 2018-03-26 NOTE — Consult Note (Signed)
[facility]  Consultation Note  Rachael Bradley is an 78 y.o. female.    Reason for Consultation:  AKI Requesting Provider:  Dr. Erlinda Hong  HPI: [id number] with CAD s/p PCI, h/o HFpEF (2015), HTN, COPD on O2 2L all the time, anemia is seen in consultation for AKI.  She presented to [facility] yesterday with fatigue, lethargy, poor po intake and decreased UOP (chart notes dyspnea but per daughters this was not the predominant issue).  She was transferred to [facility] after found to be in ARF with Cr 7.5.   They report fatigue and lethargy, poor po intake, flank pain and dec UOP dating to mid last week.  Sat 2/29 was seen at [facility] ED and treated with neb and given cough med. Daughters report CBC checked no other labs.  By report her Cr was normal at check up with PCP earlier that week but this info is not immediately available.   No NSAID use, no diuretics, no GI complaints.  Oral intake was minimal due to lethargy.  She was hypertensive to 160s on arrival here and has remained at this level.  She's being hydrated with NS 45mL/hr.    UOP since admission last PM is 745mL.  Renal US shows hyperechoic parenchyma R 10, L 11.1.  Mild hydronephrosis BL.  CXR with emphysema, nothing acute.  Urine studies have not been sent yet.  PVR just checked and was 13mL.   She's been using bedpan during admission.   Daughters provide most of the history - she did not add anything when asked directly.   Generally she is independent of ADLs. Son lives with her but she drives and is fairly independent.  PMH: Past Medical History:  Diagnosis Date  . Acid reflux   . CHF (congestive heart failure) ([place])   . Colon polyps   . COPD (chronic obstructive pulmonary disease) ([place])   . Coronary artery disease   . Hypertension   . Lung cancer ([place])   . MI (myocardial infarction) ([place])   . Oxygen dependent    2 lpm via [place],   . Peripheral arterial disease (HCC)    PSH: Past Surgical History:  Procedure Laterality  Date  .  stent    . CARDIAC CATHETERIZATION     with stents,   . CHOLECYSTECTOMY    . LOBECTOMY    . LUNG LOBECTOMY     entire left lung,   . TONSILLECTOMY    . TUBAL LIGATION     Past Medical History:  Diagnosis Date  . Acid reflux   . CHF (congestive heart failure) ([place])   . Colon polyps   . COPD (chronic obstructive pulmonary disease) ([place])   . Coronary artery disease   . Hypertension   . Lung cancer ([place])   . MI (myocardial infarction) ([place])   . Oxygen dependent    2 lpm via [place],   . Peripheral arterial disease (HCC)     Medications:  I have reviewed the patient's current medications.  Medications Prior to Admission  Medication Sig Dispense Refill  . acetaminophen (TYLENOL) 500 MG tablet Take 500 mg by mouth every 6 (six) hours as needed for mild pain.    [name] albuterol (PROVENTIL HFA;VENTOLIN HFA) 108 (90 Base) MCG/ACT inhaler Inhale 2 puffs into the lungs every 6 (six) hours as needed for wheezing or shortness of breath.    [name] aspirin EC 81 MG tablet Take 81 mg by mouth daily.    . budesonide-formoterol (SYMBICORT) 160-4.5  MCG/ACT inhaler Inhale 2 puffs into the lungs 2 (two) times daily. 1 Inhaler 12  . clopidogrel (PLAVIX) 75 MG tablet Take 75 mg by mouth daily.    . furosemide (LASIX) 20 MG tablet Take 1 tablet (20 mg total) by mouth 2 (two) times daily. (Patient taking differently: Take 20 mg by mouth 2 (two) times daily as needed for fluid or edema. ) 60 tablet 0  . ipratropium (ATROVENT) 0.02 % nebulizer solution Take 2.5 mLs (0.5 mg total) by nebulization every 6 (six) hours. 75 mL 12  . isosorbide mononitrate (IMDUR) 30 MG 24 hr tablet Take 15 mg by mouth 2 (two) times daily.    . metoprolol tartrate (LOPRESSOR) 25 MG tablet Take 1 tablet (25 mg total) by mouth 2 (two) times daily. 60 tablet 0  . nitroGLYCERIN (NITROSTAT) 0.4 MG SL tablet Place 0.4 mg under the tongue every 5 (five) minutes as needed for chest pain.    [name] omeprazole (PRILOSEC OTC) 20 MG tablet Take 20  mg by mouth daily.    . polyethylene glycol powder (GLYCOLAX/MIRALAX) powder Take 17 g by mouth daily as needed for mild constipation.    . rosuvastatin (CRESTOR) 20 MG tablet Take 20 mg by mouth at bedtime.    [name] alum & mag hydroxide-simeth (MAALOX/MYLANTA) 200-200-20 MG/5ML suspension Take 30 mLs by mouth every 6 (six) hours as needed for indigestion or heartburn (dyspepsia). (Patient not taking: Reported on 03/26/2018) 355 mL 0    ALLERGIES:   Allergies  Allergen Reactions  . Ambien [Zolpidem] Other (See Comments)    Severe confusion/disorientation   . Lisinopril Cough  . Penicillins Hives and Other (See Comments)    Edema Did it involve swelling of the face/tongue/throat, SOB, or low BP? Yes Did it involve sudden or severe rash/hives, skin peeling, or any reaction on the inside of your mouth or nose? yes Did you need to seek medical attention at a hospital or doctor's office? Yes When did it last happen?many years back If all above answers are "NO", may proceed with cephalosporin use.     FAM HX: Family History  Problem Relation Age of Onset  . Stroke Mother   . Hypertension Mother   . Hypertension Father   . Cancer Sister   . Heart failure Sister   . Hypertension Sister     Social History:   reports that she has quit smoking. She has never used smokeless tobacco. She reports that she does not drink alcohol or use drugs.  ROS: 12 system ROS per HPI above   Blood pressure (!) 166/71, pulse 95, temperature 97.6 F (36.4 C), temperature source Oral, resp. rate 18, weight 32 kg, SpO2 98 %. PHYSICAL EXAM: Gen: elderly thin woman lying at 30 degrees in no distress Eyes: anicteric, EOMI ENT: MM tacky, poor dentition CV: RRR, II/VI syst murmur Lungs: normal WOB, a few scattered rhonchi, no rales Abd: thin, mildly TTP throughout, no rebound, +BS Extr: no edema GU: no foley Neuro: oriented to March 2020, self, [place]   Results for orders placed or performed during  the hospital encounter of 03/25/18 (from the past 48 hour(s))  Basic metabolic panel     Status: Abnormal   Collection Time: 03/26/18  2:46 AM  Result Value Ref Range   Sodium 137 135 - 145 mmol/L   Potassium 3.8 3.5 - 5.1 mmol/L   Chloride 101 98 - 111 mmol/L   CO2 22 22 - 32 mmol/L   Glucose, Bld 125 (H)  70 - 99 mg/dL   BUN 74 (H) 8 - 23 mg/dL   Creatinine, Ser 7.35 (H) 0.44 - 1.00 mg/dL   Calcium 7.7 (L) 8.9 - 10.3 mg/dL   GFR calc non Af Amer 5 (L) >60 mL/min   GFR calc Af Amer 6 (L) >60 mL/min   Anion gap 14 5 - 15    Comment: Performed at [facility] [address], [place], [place] [address]  CBC with Differential/Platelet     Status: Abnormal   Collection Time: 03/26/18  2:46 AM  Result Value Ref Range   WBC 8.1 4.0 - 10.5 K/uL   RBC 3.58 (L) 3.87 - 5.11 MIL/uL   Hemoglobin 9.5 (L) 12.0 - 15.0 g/dL   HCT 31.1 (L) 36.0 - 46.0 %   MCV 86.9 80.0 - 100.0 fL   MCH 26.5 26.0 - 34.0 pg   MCHC 30.5 30.0 - 36.0 g/dL   RDW 17.1 (H) 11.5 - 15.5 %   Platelets 137 (L) 150 - 400 K/uL   nRBC 0.0 0.0 - 0.2 %   Neutrophils Relative % 88 %   Neutro Abs 7.1 1.7 - 7.7 K/uL   Lymphocytes Relative 3 %   Lymphs Abs 0.3 (L) 0.7 - 4.0 K/uL   Monocytes Relative 8 %   Monocytes Absolute 0.6 0.1 - 1.0 K/uL   Eosinophils Relative 0 %   Eosinophils Absolute 0.0 0.0 - 0.5 K/uL   Basophils Relative 0 %   Basophils Absolute 0.0 0.0 - 0.1 K/uL   Immature Granulocytes 1 %   Abs Immature Granulocytes 0.05 0.00 - 0.07 K/uL    Comment: Performed at [facility], [place] [address], [place], [place] [address]  Hepatic function panel     Status: Abnormal   Collection Time: 03/26/18  2:46 AM  Result Value Ref Range   Total Protein 5.9 (L) 6.5 - 8.1 g/dL   Albumin 2.5 (L) 3.5 - 5.0 g/dL   AST 74 (H) 15 - 41 U/L   ALT 26 0 - 44 U/L   Alkaline Phosphatase 108 38 - 126 U/L   Total Bilirubin 0.4 0.3 - 1.2 mg/dL   Bilirubin, Direct 0.1 0.0 - 0.2 mg/dL   Indirect Bilirubin 0.3 0.3 - 0.9 mg/dL     Comment: Performed at [facility], [place] [address] [place], [place] [address]  CK     Status: Abnormal   Collection Time: 03/26/18  2:46 AM  Result Value Ref Range   Total CK 264 (H) 38 - 234 U/L    Comment: Performed at [facility], [place] [address], [place], [place] [address]  Troponin I - Once     Status: Abnormal   Collection Time: 03/26/18  2:46 AM  Result Value Ref Range   Troponin I 0.03 (HH) <0.03 ng/mL    Comment: CRITICAL RESULT CALLED TO, READ BACK BY AND VERIFIED WITHNiel Hummer [id number] WILDERK Performed at [facility], [place] [address] [place], [place] [address]   Brain natriuretic peptide     Status: Abnormal   Collection Time: 03/26/18  4:51 AM  Result Value Ref Range   B Natriuretic Peptide 256.5 (H) 0.0 - 100.0 pg/mL    Comment: Performed at [facility] [address], [place], [place] [address]  D-dimer, quantitative (not at [facility])     Status: Abnormal   Collection Time: 03/26/18  4:51 AM  Result Value Ref Range  D-Dimer, Quant 1.32 (H) 0.00 - 0.50 ug/mL-FEU    Comment: (NOTE) At the manufacturer cut-off of 0.50 ug/mL FEU, this assay has been documented to exclude PE with a sensitivity and negative predictive value of 97 to 99%.  At this time, this assay has not been approved by the FDA to exclude DVT/VTE. Results should be correlated with clinical presentation. Performed at [facility], [place] [address], [place], [place] [address]   Phosphorus     Status: Abnormal   Collection Time: 03/26/18  4:51 AM  Result Value Ref Range   Phosphorus 5.9 (H) 2.5 - 4.6 mg/dL    Comment: Performed at [facility] [address], [place], [place] [address]  Vitamin B12     Status: Abnormal   Collection Time: 03/26/18  4:51 AM  Result Value Ref Range   Vitamin B-12 2,726 (H) 180 - 914 pg/mL    Comment: (NOTE) This assay is not validated for testing neonatal or myeloproliferative syndrome specimens for Vitamin B12  levels. Performed at [facility], [place] [address] [place], [place] [address]   Folate     Status: None   Collection Time: 03/26/18  4:51 AM  Result Value Ref Range   Folate 11.9 >5.9 ng/mL    Comment: Performed at [facility], [place] [address] [place], [place] [address]  Iron and TIBC     Status: Abnormal   Collection Time: 03/26/18  4:51 AM  Result Value Ref Range   Iron 11 (L) 28 - 170 ug/dL   TIBC 279 250 - 450 ug/dL   Saturation Ratios 4 (L) 10.4 - 31.8 %   UIBC 268 ug/dL    Comment: Performed at [facility], [place] [address], [place], [place] [address]  Ferritin     Status: None   Collection Time: 03/26/18  4:51 AM  Result Value Ref Range   Ferritin 199 11 - 307 ng/mL    Comment: Performed at [facility], [place] [address], [place], [place] [address]  Reticulocytes     Status: Abnormal   Collection Time: 03/26/18  4:51 AM  Result Value Ref Range   Retic Ct Pct 0.5 0.4 - 3.1 %   RBC. 3.64 (L) 3.87 - 5.11 MIL/uL   Retic Count, Absolute 16.7 (L) 19.0 - 186.0 K/uL   Immature Retic Fract 3.3 2.3 - 15.9 %    Comment: Performed at [facility] [address] [place], [place] [address]    US Renal  Result Date: 03/26/2018 CLINICAL DATA:  Acute kidney injury EXAM: RENAL / URINARY TRACT ULTRASOUND COMPLETE COMPARISON:  None. FINDINGS: Right Kidney: Renal measurements: 10 x 4.3 x 5.3 cm = volume: 122 mL. Renal parenchyma is hyperechoic. There is mild hydronephrosis. Left Kidney: Renal measurements: 11.1 x 4.8 x 4.8 cm = volume: 139 mL mL. Echogenic renal parenchyma. Mild hydronephrosis. Bladder: Appears normal for degree of bladder distention. IMPRESSION: Mild bilateral hydronephrosis and hyperechoic renal parenchyma, consistent with chronic medical renal disease. Electronically Signed   By: Ulyses Jarred M.D.   On: 03/26/2018 05:41   Dg Chest Port 1 View  Result Date: 03/26/2018 CLINICAL DATA:  Shortness of breath EXAM: PORTABLE CHEST 1 VIEW COMPARISON:   03/25/2018, 03/21/2018 FINDINGS: Hyperinflated lungs with emphysematous disease. Postsurgical changes in the left lung apex. No pleural effusion or acute consolidation. Stable cardiomediastinal silhouette with aortic atherosclerosis. No pneumothorax. IMPRESSION: No active disease. Hyperinflation with emphysematous disease and left  upper postsurgical changes. Electronically Signed   By: Donavan Foil M.D.   On: 03/26/2018 02:29    Assessment/Plan **Severe AKI, nonoliguric: If renal function per report was normal just 7-10d ago, this appears to most likely be a hypovolemic insult in the setting of poor po intake, though with the flank pain and mild hydronephrosis obstruction also a concern.  In setting of anemia r/o myeloma.   --Ok with no foley as she's continent and PVRs have been < 100 --Continue to track I/Os --Hydrate with NS 100/hr overnight.  Assess tomorrow if additional fluids needed. --Repeat renal US tomorrow after hydration to assess if hydronephrosis is worse as oliguria improves -- if so would need to have urology evaluation --UA and urine indices pending --Further w/u, biopsy dictated by course --No indications for RRT at this time and hopefully will improve, if not will need to discuss more.  Brief discussion with daughters today.  **Anemia:  Hb 9.5.  Ferritin 199, iron sat 4.  Would benefit from iron.  Myeloma w/u per above. Iron studies show severe iron deficiency.  Will need eval.   **HTN:  Metoprolol 25 BID and Imdur 30 currently (home meds).  Monitor for now and if remains hypertensive may need additional agent.   **COPD: nebs per primary  Justin Mend 03/26/2018, 2:09 PM

## 2018-03-26 NOTE — Progress Notes (Signed)
Patient refused to get in and out catheterization for urine sample.

## 2018-03-26 NOTE — H&P (Addendum)
History and Physical    Magon Croson QMG:[id number] DOB: [birth year] DOA: 03/25/2018  PCP: Normand Sloop, MD  Patient coming from: Home.  Patient was transferred from [facility].  Chief Complaint: Shortness of breath.  HPI: Rachael Bradley is a 78 y.o. female with history of CAD status post stenting, diastolic dysfunction per 2D echo in 2015, hypertension, COPD on home oxygen, anemia and previous history of tobacco abuse had presented to the ER at [facility] with complaint of shortness of breath.  Denies any chest pain productive cough fever or chills.  Shortness of breath is present even at rest and increased on exertion.  Patient was found to be wheezing and was given nebulizer treatment.  Had come up with similar symptoms 5 days ago and at that time was treated empirically.  Noted in the labs were patient's creatinine is markedly increased from February 29 when patient had come.  On March 21, 2018 patient's creatinine was normal.  And now it was 7.45.  BUN of 70 anion gap of 21 sodium 134 chloride 68 ABG done showed pH of 7.32 PCO2 of 62.  Patient denies taking any NSAIDs or any diuretics since her last visit in the ER 5 days ago.  Denies nausea vomiting abdominal pain diarrhea fever chills.  In the ER patient was given 1 L fluid bolus.  And since there was no nephrologist was transferred to [facility].  On my exam patient is not in distress.  Able to make urine.  UA is pending.  Patient admitted for acute renal failure and shortness of breath.  ED Course: Patient with a direct admit.  Review of Systems: As per HPI, rest all negative.   Past Medical History:  Diagnosis Date  . Acid reflux   . CHF (congestive heart failure) ([place])   . Colon polyps   . COPD (chronic obstructive pulmonary disease) ([place])   . Coronary artery disease   . Hypertension   . Lung cancer ([place])   . MI (myocardial infarction) ([place])   . Oxygen dependent    2 lpm via [place],   . Peripheral  arterial disease ([place])     Past Surgical History:  Procedure Laterality Date  .  stent    . CARDIAC CATHETERIZATION     with stents,   . CHOLECYSTECTOMY    . LOBECTOMY    . LUNG LOBECTOMY     entire left lung,   . TONSILLECTOMY    . TUBAL LIGATION       reports that she has quit smoking. She has never used smokeless tobacco. She reports that she does not drink alcohol or use drugs.  Allergies  Allergen Reactions  . Ambien [Zolpidem]     Severe confusion/disorientation   . Penicillins Hives and Other (See Comments)    Edema    Family History  Problem Relation Age of Onset  . Stroke Mother   . Hypertension Mother   . Hypertension Father   . Cancer Sister   . Heart failure Sister   . Hypertension Sister     Prior to Admission medications   Medication Sig Start Date End Date Taking? Authorizing Provider  albuterol (PROVENTIL HFA) 108 (90 BASE) MCG/ACT inhaler Inhale 2 puffs into the lungs every 6 (six) hours as needed for wheezing or shortness of breath.    [provider]  alum & mag hydroxide-simeth (MAALOX/MYLANTA) 200-200-20 MG/5ML suspension Take 30 mLs by mouth every 6 (six) hours as needed for indigestion  or heartburn (dyspepsia). 12/08/13   Dhungel, Nishant, MD  amLODipine (NORVASC) 5 MG tablet Take 5 mg by mouth daily.    [provider]  aspirin EC 81 MG tablet Take 81 mg by mouth daily.    [provider]  budesonide-formoterol (SYMBICORT) 160-4.5 MCG/ACT inhaler Inhale 2 puffs into the lungs 2 (two) times daily. 04/07/13   Nita Sells, MD  furosemide (LASIX) 20 MG tablet Take 1 tablet (20 mg total) by mouth 2 (two) times daily. 12/08/13   Dhungel, Flonnie Overman, MD  guaiFENesin (MUCINEX) 600 MG 12 hr tablet Take 1 tablet (600 mg total) by mouth 2 (two) times daily. Patient not taking: Reported on 01/03/2014 12/08/13   Dhungel, Flonnie Overman, MD  ipratropium (ATROVENT) 0.02 % nebulizer solution Take 2.5 mLs (0.5 mg total) by nebulization  every 6 (six) hours. 04/07/13   Nita Sells, MD  isosorbide mononitrate (IMDUR) 15 mg TB24 24 hr tablet Take 0.5 tablets (15 mg total) by mouth daily. 12/08/13   Dhungel, Flonnie Overman, MD  metoprolol tartrate (LOPRESSOR) 25 MG tablet Take 1 tablet (25 mg total) by mouth 2 (two) times daily. 12/08/13   Dhungel, Flonnie Overman, MD  nitroGLYCERIN (NITROSTAT) 0.4 MG SL tablet Place 0.4 mg under the tongue every 5 (five) minutes as needed for chest pain.    [provider]  omeprazole (PRILOSEC OTC) 20 MG tablet Take 20 mg by mouth daily.    [provider]  oxymetazoline (AFRIN) 0.05 % nasal spray Place 1 spray into both nostrils 2 (two) times daily. Patient not taking: Reported on 01/03/2014 12/08/13   Dhungel, Flonnie Overman, MD  rosuvastatin (CRESTOR) 20 MG tablet Take 20 mg by mouth at bedtime.    [provider]  traMADol (ULTRAM) 50 MG tablet Take 1 tablet (50 mg total) by mouth every 6 (six) hours as needed for moderate pain (HA). Patient not taking: Reported on 01/03/2014 12/08/13   Louellen Molder, MD    Physical Exam: Vitals:   03/25/18 2327  BP: (!) 162/72  Pulse: (!) 110  Resp: 18  Temp: 98.1 F (36.7 C)  TempSrc: Oral  SpO2: 97%  Weight: 32 kg      Constitutional: Moderately built and nourished. Vitals:   03/25/18 2327  BP: (!) 162/72  Pulse: (!) 110  Resp: 18  Temp: 98.1 F (36.7 C)  TempSrc: Oral  SpO2: 97%  Weight: 32 kg   Eyes: Anicteric no pallor. ENMT: No discharge from the ears eyes nose and mouth. Neck: No mass felt.  No neck rigidity. Respiratory: No rhonchi or crepitations. Cardiovascular: S1-S2 heard. Abdomen: Soft nontender bowel sounds present. Musculoskeletal: No edema.  No joint effusion.   Skin: No rash. Neurologic: Alert awake oriented to time place and person.  Moves all extremities. Psychiatric: Appears normal.   Labs on Admission: I have personally reviewed following labs and imaging studies  CBC: No results for  input(s): WBC, NEUTROABS, HGB, HCT, MCV, PLT in the last 168 hours. Basic Metabolic Panel: No results for input(s): NA, K, CL, CO2, GLUCOSE, BUN, CREATININE, CALCIUM, MG, PHOS in the last 168 hours. GFR: CrCl cannot be calculated (Patient's most recent lab result is older than the maximum 21 days allowed.). Liver Function Tests: No results for input(s): AST, ALT, ALKPHOS, BILITOT, PROT, ALBUMIN in the last 168 hours. No results for input(s): LIPASE, AMYLASE in the last 168 hours. No results for input(s): AMMONIA in the last 168 hours. Coagulation Profile: No results for input(s): INR, PROTIME in the last 168 hours. Cardiac  Enzymes: No results for input(s): CKTOTAL, CKMB, CKMBINDEX, TROPONINI in the last 168 hours. BNP (last 3 results) No results for input(s): PROBNP in the last 8760 hours. HbA1C: No results for input(s): HGBA1C in the last 72 hours. CBG: No results for input(s): GLUCAP in the last 168 hours. Lipid Profile: No results for input(s): CHOL, HDL, LDLCALC, TRIG, CHOLHDL, LDLDIRECT in the last 72 hours. Thyroid Function Tests: No results for input(s): TSH, T4TOTAL, FREET4, T3FREE, THYROIDAB in the last 72 hours. Anemia Panel: No results for input(s): VITAMINB12, FOLATE, FERRITIN, TIBC, IRON, RETICCTPCT in the last 72 hours. Urine analysis:    Component Value Date/Time   COLORURINE YELLOW 04/03/2013 2150   APPEARANCEUR CLEAR 04/03/2013 2150   LABSPEC <1.005 (L) 04/03/2013 2150   PHURINE 6.5 04/03/2013 2150   GLUCOSEU NEGATIVE 04/03/2013 2150   HGBUR TRACE (A) 04/03/2013 2150   BILIRUBINUR NEGATIVE 04/03/2013 2150   KETONESUR NEGATIVE 04/03/2013 2150   PROTEINUR NEGATIVE 04/03/2013 2150   UROBILINOGEN 0.2 04/03/2013 2150   NITRITE NEGATIVE 04/03/2013 2150   LEUKOCYTESUR NEGATIVE 04/03/2013 2150   Sepsis Labs: @LABRCNTIP (procalcitonin:4,lacticidven:4) )No results found for this or any previous visit (from the past 240 hour(s)).   Radiological Exams on  Admission: Dg Chest Port 1 View  Result Date: 03/26/2018 CLINICAL DATA:  Shortness of breath EXAM: PORTABLE CHEST 1 VIEW COMPARISON:  03/25/2018, 03/21/2018 FINDINGS: Hyperinflated lungs with emphysematous disease. Postsurgical changes in the left lung apex. No pleural effusion or acute consolidation. Stable cardiomediastinal silhouette with aortic atherosclerosis. No pneumothorax. IMPRESSION: No active disease. Hyperinflation with emphysematous disease and left upper postsurgical changes. Electronically Signed   By: Donavan Foil M.D.   On: 03/26/2018 02:29    EKG: Independently reviewed.  E[facility] done at [facility] was showing normal sinus rhythm.  Assessment/Plan Principal Problem:   ARF (acute renal failure) (HCC) Active Problems:   CAD (coronary artery disease) of artery bypass graft   COPD (chronic obstructive pulmonary disease) ([place])    1. Acute renal failure appears to be nonoliguric cause not clear -UA is pending.  Check FENa.  Check repeat metabolic panel CBC renal ultrasound SPEP UPEP phosphorus level CK levels and will gently hydrate for now. 2. Shortness of breath could be from COPD.  Will keep patient on scheduled dose of nebulizer treatment.  Check BNP d-dimer troponin 2D echo. 3. History of CAD denies any chest pain.  Continue antiplatelet agents statins and beta-blocker.  Check 2D echo. 4. COPD continue inhalers. 5. Anemia appears to be chronic.  Follow CBC.  Check anemia panel. 6. Hypertension -patient is on beta-blocker and Imdur.  Added PRN IV hydralazine.  All labs including chest x-ray EKG are pending. Home medications has to be verified.   DVT prophylaxis: SCDs for now as patient may need biopsy. Code Status: Full code. Family Communication: Discussed with patient. Disposition Plan: Home. Consults called: None. Admission status: Inpatient.   Rise Patience MD [facility] Pager [phone].  If 7PM-7AM, please contact  night-coverage [email or web address] Password [email or web address]  03/26/2018, 2:59 AM

## 2018-03-26 NOTE — Progress Notes (Signed)
  Echocardiogram 2D Echocardiogram has been performed.  Rachael Bradley 03/26/2018, 3:11 PM

## 2018-03-26 NOTE — Progress Notes (Signed)
Patient is admitted this am , she initially went to [facility] ED due to c/o sob, she is found to have aki, is transferred to [facility]. Patient is seen and examined with multiple family at bedside Family reports patient has been having progressive weakness, decreased appetite for the last few weeks, no vomiting, no diarrhea. No known baseline, total 750cc documented since admission. Renal US with mild bilateral hydro, she does reports some back pain, does has mild bilateral CVA tenderness, will order bladder scan q8hrs. Continue hydration, nephrology consulted.

## 2018-03-27 ENCOUNTER — Other Ambulatory Visit (HOSPITAL_COMMUNITY): Payer: Medicare Other

## 2018-03-27 ENCOUNTER — Inpatient Hospital Stay (HOSPITAL_COMMUNITY): Payer: Medicare Other

## 2018-03-27 DIAGNOSIS — D638 Anemia in other chronic diseases classified elsewhere: Secondary | ICD-10-CM

## 2018-03-27 DIAGNOSIS — I5032 Chronic diastolic (congestive) heart failure: Secondary | ICD-10-CM

## 2018-03-27 DIAGNOSIS — R0602 Shortness of breath: Secondary | ICD-10-CM

## 2018-03-27 DIAGNOSIS — I739 Peripheral vascular disease, unspecified: Secondary | ICD-10-CM

## 2018-03-27 DIAGNOSIS — J9611 Chronic respiratory failure with hypoxia: Secondary | ICD-10-CM

## 2018-03-27 DIAGNOSIS — Z515 Encounter for palliative care: Secondary | ICD-10-CM

## 2018-03-27 LAB — RENAL FUNCTION PANEL
ALBUMIN: 2.5 g/dL — AB (ref 3.5–5.0)
Anion gap: 12 (ref 5–15)
BUN: 99 mg/dL — AB (ref 8–23)
CO2: 20 mmol/L — ABNORMAL LOW (ref 22–32)
Calcium: 7.7 mg/dL — ABNORMAL LOW (ref 8.9–10.3)
Chloride: 105 mmol/L (ref 98–111)
Creatinine, Ser: 7.81 mg/dL — ABNORMAL HIGH (ref 0.44–1.00)
GFR calc Af Amer: 5 mL/min — ABNORMAL LOW (ref >60)
GFR calc non Af Amer: 5 mL/min — ABNORMAL LOW (ref >60)
Glucose, Bld: 112 mg/dL — ABNORMAL HIGH (ref 70–99)
Phosphorus: 4.9 mg/dL — ABNORMAL HIGH (ref 2.5–4.6)
Potassium: 3.9 mmol/L (ref 3.5–5.1)
SODIUM: 137 mmol/L (ref 135–145)

## 2018-03-27 MED ORDER — SODIUM CHLORIDE 0.9 % IV SOLN
125.0000 mg | Freq: Every day | INTRAVENOUS | Status: DC
  Administered 2018-03-27 – 2018-03-30 (×4): 125 mg via INTRAVENOUS
  Filled 2018-03-27 (×4): qty 10

## 2018-03-27 MED ORDER — TRAZODONE HCL 50 MG PO TABS
50.0000 mg | ORAL_TABLET | Freq: Once | ORAL | Status: AC
  Administered 2018-03-27: 50 mg via ORAL
  Filled 2018-03-27: qty 1

## 2018-03-27 MED ORDER — SENNOSIDES-DOCUSATE SODIUM 8.6-50 MG PO TABS
1.0000 | ORAL_TABLET | Freq: Two times a day (BID) | ORAL | Status: DC
  Administered 2018-03-27 – 2018-03-30 (×6): 1 via ORAL
  Filled 2018-03-27 (×6): qty 1

## 2018-03-27 MED ORDER — IPRATROPIUM-ALBUTEROL 0.5-2.5 (3) MG/3ML IN SOLN
3.0000 mL | Freq: Four times a day (QID) | RESPIRATORY_TRACT | Status: DC
  Administered 2018-03-27 – 2018-03-30 (×12): 3 mL via RESPIRATORY_TRACT
  Filled 2018-03-27 (×12): qty 3

## 2018-03-27 MED ORDER — SODIUM CHLORIDE 0.9 % IV SOLN
INTRAVENOUS | Status: DC
  Administered 2018-03-27: 10:00:00 via INTRAVENOUS

## 2018-03-27 MED ORDER — SODIUM BICARBONATE 8.4 % IV SOLN
INTRAVENOUS | Status: DC
  Administered 2018-03-27 (×2): via INTRAVENOUS
  Filled 2018-03-27 (×3): qty 150

## 2018-03-27 NOTE — Care Management Important Message (Signed)
Important Message  Patient Details  Name: Rachael Bradley MRN: [id number] Date of Birth: 1940-10-15   Medicare Important Message Given:  Yes    Shelda Altes 03/27/2018, 1:47 PM

## 2018-03-27 NOTE — Progress Notes (Signed)
PROGRESS NOTE  Rachael Bradley NKN:[id number] DOB: 02/16/1940 DOA: 03/25/2018 PCP: Margy Clarks, NP  HPI/Recap of past 24 hours:  600 cc Urine output documented last 24hrs Patient appear having increased respiratory effort although Daughter reports patient's breathing is at baseline She continue to have poor appetite, no fever, cr remain elevated  Assessment/Plan: Principal Problem:   ARF (acute renal failure) ([place]) Active Problems:   CAD (coronary artery disease) of artery bypass graft   COPD (chronic obstructive pulmonary disease) ([place])  AKI: Unclear etiology, work up in progress, appreciate nephrology input.  Normocytic anemia: Likely anemia of chronic disease, no overt bleeding Monitor cbc  COPD with h/o lung cancer s/p lobectomy, chronic hypoxic respiratory failure on home 02 2liters 24/7 No wheezing Poor lung reserve, very diminished breath sound overall Continue home meds symbicort, prn nebs  H/o CAD s/p stent, h/o PAD, h/o chf (unknown detail, possible diastolic, currently dry) Denies chest pain Echo lvef 60% On asa/plavix/statin/betablocker  HTN: stable on betablocker and imdur  Code Status: full  Family Communication: patient and daughter who is a Therapist, sports  Disposition Plan: not ready to discharge   Consultants:  Nephrology   Procedures:  none  Antibiotics:  none   Objective: BP (!) 127/55 (BP Location: Right Arm)   Pulse (!) 102   Temp (!) 97.5 F (36.4 C) (Oral)   Resp 18   Wt 32 kg   SpO2 94%   BMI 15.27 kg/m   Intake/Output Summary (Last 24 hours) at 03/27/2018 1035 Last data filed at 03/27/2018 0600 Gross per 24 hour  Intake 1656.12 ml  Output 257 ml  Net 1399.12 ml   Filed Weights   03/25/18 2327 03/26/18 2014  Weight: 32 kg 32 kg    Exam: Patient is examined daily including today on 03/27/2018, exams remain the same as of yesterday except that has changed    General:  NAD, very frail elderly  Cardiovascular: RRR  Respiratory:  lung diminished, no wheezing today  Abdomen: Soft/ND/NT, positive BS  Musculoskeletal: No Edema  Neuro: alert, oriented   Data Reviewed: Basic Metabolic Panel: Recent Labs  Lab 03/26/18 0246 03/26/18 0451 03/27/18 0443  NA 137  --  137  K 3.8  --  3.9  CL 101  --  105  CO2 22  --  20*  GLUCOSE 125*  --  112*  BUN 74*  --  99*  CREATININE 7.35*  --  7.81*  CALCIUM 7.7*  --  7.7*  PHOS  --  5.9* 4.9*   Liver Function Tests: Recent Labs  Lab 03/26/18 0246 03/27/18 0443  AST 74*  --   ALT 26  --   ALKPHOS 108  --   BILITOT 0.4  --   PROT 5.9*  --   ALBUMIN 2.5* 2.5*   No results for input(s): LIPASE, AMYLASE in the last 168 hours. No results for input(s): AMMONIA in the last 168 hours. CBC: Recent Labs  Lab 03/26/18 0246  WBC 8.1  NEUTROABS 7.1  HGB 9.5*  HCT 31.1*  MCV 86.9  PLT 137*   Cardiac Enzymes:   Recent Labs  Lab 03/26/18 0246  CKTOTAL 264*  TROPONINI 0.03*   BNP (last 3 results) Recent Labs    03/26/18 0451  BNP 256.5*    ProBNP (last 3 results) No results for input(s): PROBNP in the last 8760 hours.  CBG: No results for input(s): GLUCAP in the last 168 hours.  No results found for this or any previous  visit (from the past 240 hour(s)).   Studies: No results found.  Scheduled Meds: . aspirin EC  81 mg Oral Daily  . clopidogrel  75 mg Oral Daily  . ipratropium-albuterol  3 mL Nebulization Q4H  . isosorbide mononitrate  30 mg Oral Daily  . metoprolol tartrate  25 mg Oral BID  . mometasone-formoterol  2 puff Inhalation BID  . rosuvastatin  20 mg Oral QHS    Continuous Infusions: . sodium chloride 75 mL/hr at 03/27/18 7096     Time spent: 24mins I have personally reviewed and interpreted on  03/27/2018 daily labs, tele strips, imagings as discussed above under date review session and assessment and plans.  I reviewed all nursing notes, pharmacy notes, consultant notes,  vitals, pertinent old records  I have discussed plan  of care as described above with RN , patient and family on 03/27/2018   Florencia Reasons MD, PhD  [facility] Pager [phone]. If 7PM-7AM, please contact night-coverage at [email or web address], password [facility] 03/27/2018, 10:35 AM  LOS: 2 days

## 2018-03-27 NOTE — Consult Note (Signed)
Consultation Note Date: 03/27/2018   Patient Name: Rachael Bradley  DOB: 02-13-1940  MRN: [id number]  Age / Sex: 78 y.o., female  PCP: Margy Clarks, NP Referring Physician: Florencia Reasons, MD  Reason for Consultation: Establishing goals of care and Psychosocial/spiritual support  HPI/Patient Profile: 78 y.o. female  with past medical history of lung cancer 2002 status post complete left lobectomy, AVM duodenum, anemia, COPD on home O2, hypertension, diastolic heart failure, history of MI, coronary artery disease status post stenting admitted on 03/25/2018 with acute shortness of breath.  Patient was seen at [facility] in [place] with complaints of shortness of breath approximately 5 days prior to admission to [facility] on 03/25/2018.  At that time her creatinine level on 2/29 was WNL.  Upon admission.  Patient's creatinine was 7.45.  Renal ultrasound performed and resulted mild bilateral hydronephrosis.  Consult ordered for goals of care  Clinical Assessment and Goals of Care: Patient seen, chart reviewed.  Patient's daughter Alwyn Ren and granddaughter are at the bedside.  Patient drifts off to sleep very frequently, lethargic, but awakens easily and then appears to be alert and oriented x4.  She states she has been "sick all over" now for a couple of weeks.  Her daughter Alwyn Ren, has an advanced directive with her from 2018 that reflects full scope of treatment.  Also during my assessment, bedside RN performed bladder scan and patient had 409 mL's in her bladder.  We have gotten her up to the bedside commode but thus far she has been unable to urinate  Introduced palliative medicine services as an additional resource and source of support.  Compared palliative care versus hospice care.  Also began discussions regarding a comfort based symptom based approach to care versus aggressive medical trajectories.  Daughter states  she seen her go through a lot but has never seen her this sick.  We began discussions regarding goals of care specifically CODE STATUS.  Patient stated she did not think she would want to be intubated but wanted to talk to her family further about her goals.  Family reports that they have been told that she is not a candidate for hemodialysis because of her underlying frailty; risks would outweigh benefit  Patient is retired from [employer].  She has 4 children, 3 daughters and 1 son.  Her son lives with her     SUMMARY OF RECOMMENDATIONS   Full code for now.  Patient is leaning towards no intubation but wants to have further discussions with her family and see what CT shows Plan to meet family again on 03/28/2018 at 54 AM [facility] choices for [employer] booklet given to patient and family Code Status/Advance Care Planning:  Full code    Symptom Management:   Dyspnea: Continue with targeted pulmonary treatments.  Did generally introduced concepts regarding opioids for management of acute dyspnea  Palliative Prophylaxis:   Aspiration, Bowel Regimen, Delirium Protocol, Eye Care, Frequent Pain Assessment, Oral Care and Turn Reposition  Additional Recommendations (Limitations, Scope, Preferences):  Full Scope Treatment  Psycho-social/Spiritual:   Desire for further Chaplaincy support:no  Additional Recommendations: Referral to Community Resources   Prognosis:   At this point if patient is not a candidate for hemodialysis and there is not a correctable underlying cause, patient would have a prognosis of approximately days to weeks in the setting of end-stage renal disease with underlying frailty  Discharge Planning: To Be Determined      Primary Diagnoses: Present on Admission: . ARF (acute renal failure) ([place]) . CAD (coronary artery disease) of artery bypass graft . COPD (chronic obstructive pulmonary disease) ([place])   I have reviewed the medical record,  interviewed the patient and family, and examined the patient. The following aspects are pertinent.  Past Medical History:  Diagnosis Date  . Acid reflux   . CHF (congestive heart failure) ([place])   . Colon polyps   . COPD (chronic obstructive pulmonary disease) ([place])   . Coronary artery disease   . Hypertension   . Lung cancer ([place])   . MI (myocardial infarction) ([place])   . Oxygen dependent    2 lpm via [place],   . Peripheral arterial disease ([place])    Social History   Socioeconomic History  . Marital status: Married    Spouse name: Not on file  . Number of children: Not on file  . Years of education: Not on file  . Highest education level: Not on file  Occupational History  . Not on file  Social Needs  . Financial resource strain: Not on file  . Food insecurity:    Worry: Not on file    Inability: Not on file  . Transportation needs:    Medical: Not on file    Non-medical: Not on file  Tobacco Use  . Smoking status: Former Research scientist (life sciences)  . Smokeless tobacco: Never Used  Substance and Sexual Activity  . Alcohol use: No  . Drug use: No  . Sexual activity: Not on file  Lifestyle  . Physical activity:    Days per week: Not on file    Minutes per session: Not on file  . Stress: Not on file  Relationships  . Social connections:    Talks on phone: Not on file    Gets together: Not on file    Attends religious service: Not on file    Active member of club or organization: Not on file    Attends meetings of clubs or organizations: Not on file    Relationship status: Not on file  Other Topics Concern  . Not on file  Social History Narrative  . Not on file   Family History  Problem Relation Age of Onset  . Stroke Mother   . Hypertension Mother   . Hypertension Father   . Cancer Sister   . Heart failure Sister   . Hypertension Sister    Scheduled Meds: . aspirin EC  81 mg Oral Daily  . clopidogrel  75 mg Oral Daily  . ipratropium-albuterol  3 mL Nebulization Q6H  .  isosorbide mononitrate  30 mg Oral Daily  . metoprolol tartrate  25 mg Oral BID  . mometasone-formoterol  2 puff Inhalation BID  . rosuvastatin  20 mg Oral QHS  . senna-docusate  1 tablet Oral BID   Continuous Infusions: . ferric gluconate (FERRLECIT/NULECIT) IV Stopped (03/27/18 1322)  .  sodium bicarbonate  infusion 1000 mL 75 mL/hr at 03/27/18 1400   PRN Meds:.acetaminophen **OR** acetaminophen, albuterol, hydrALAZINE, ipratropium-albuterol, nitroGLYCERIN, ondansetron **OR** ondansetron (  ZOFRAN) IV Medications Prior to Admission:  Prior to Admission medications   Medication Sig Start Date End Date Taking? Authorizing Provider  acetaminophen (TYLENOL) 500 MG tablet Take 500 mg by mouth every 6 (six) hours as needed for mild pain.   Yes [provider]  albuterol (PROVENTIL HFA;VENTOLIN HFA) 108 (90 Base) MCG/ACT inhaler Inhale 2 puffs into the lungs every 6 (six) hours as needed for wheezing or shortness of breath.   Yes [provider]  aspirin EC 81 MG tablet Take 81 mg by mouth daily.   Yes [provider]  budesonide-formoterol (SYMBICORT) 160-4.5 MCG/ACT inhaler Inhale 2 puffs into the lungs 2 (two) times daily. 04/07/13  Yes Nita Sells, MD  clopidogrel (PLAVIX) 75 MG tablet Take 75 mg by mouth daily.   Yes [provider]  furosemide (LASIX) 20 MG tablet Take 1 tablet (20 mg total) by mouth 2 (two) times daily. Patient taking differently: Take 20 mg by mouth 2 (two) times daily as needed for fluid or edema.  12/08/13  Yes Dhungel, Nishant, MD  ipratropium (ATROVENT) 0.02 % nebulizer solution Take 2.5 mLs (0.5 mg total) by nebulization every 6 (six) hours. 04/07/13  Yes Nita Sells, MD  isosorbide mononitrate (IMDUR) 30 MG 24 hr tablet Take 15 mg by mouth 2 (two) times daily.   Yes [provider]  metoprolol tartrate (LOPRESSOR) 25 MG tablet Take 1 tablet (25 mg total) by mouth 2 (two) times daily. 12/08/13  Yes Dhungel,  Nishant, MD  nitroGLYCERIN (NITROSTAT) 0.4 MG SL tablet Place 0.4 mg under the tongue every 5 (five) minutes as needed for chest pain.   Yes [provider]  omeprazole (PRILOSEC OTC) 20 MG tablet Take 20 mg by mouth daily.   Yes [provider]  polyethylene glycol powder (GLYCOLAX/MIRALAX) powder Take 17 g by mouth daily as needed for mild constipation.   Yes [provider]  rosuvastatin (CRESTOR) 20 MG tablet Take 20 mg by mouth at bedtime.   Yes [provider]  alum & mag hydroxide-simeth (MAALOX/MYLANTA) 200-200-20 MG/5ML suspension Take 30 mLs by mouth every 6 (six) hours as needed for indigestion or heartburn (dyspepsia). Patient not taking: Reported on 03/26/2018 12/08/13   Louellen Molder, MD   Allergies  Allergen Reactions  . Ambien [Zolpidem] Other (See Comments)    Severe confusion/disorientation   . Lisinopril Cough  . Penicillins Hives and Other (See Comments)    Edema Did it involve swelling of the face/tongue/throat, SOB, or low BP? Yes Did it involve sudden or severe rash/hives, skin peeling, or any reaction on the inside of your mouth or nose? yes Did you need to seek medical attention at a hospital or doctor's office? Yes When did it last happen?many years back If all above answers are "NO", may proceed with cephalosporin use.    Review of Systems  Unable to perform ROS: Acuity of condition    Physical Exam Vitals signs and nursing note reviewed.  Constitutional:      Comments: Cachectic, frail elderly female; short of breath at rest  HENT:     Head: Normocephalic and atraumatic.  Neck:     Musculoskeletal: Normal range of motion.  Cardiovascular:     Rate and Rhythm: Tachycardia present.  Pulmonary:     Comments: Increased work of breathing at rest; pursed lip breathing Musculoskeletal: Normal range of motion.  Skin:    General: Skin is warm and dry.  Neurological:     General: No focal deficit  present.      Mental Status: She is oriented to person, place, and time.     Motor: Weakness present.     Comments: Lethargic but awakens easily  Psychiatric:     Comments: Affect constricted, no overt agitation otherwise unable to test     Vital Signs: BP (!) 129/94 (BP Location: Right Arm)   Pulse 95   Temp 98.4 F (36.9 C) (Oral)   Resp 18   Wt 32 kg   SpO2 93%   BMI 15.27 kg/m  Pain Scale: 0-10   Pain Score: 0-No pain   SpO2: SpO2: 93 % O2 Device:SpO2: 93 % O2 Flow Rate: .O2 Flow Rate (L/min): 2 L/min  IO: Intake/output summary:   Intake/Output Summary (Last 24 hours) at 03/27/2018 1820 Last data filed at 03/27/2018 1700 Gross per 24 hour  Intake 1354.72 ml  Output 257 ml  Net 1097.72 ml    LBM: Last BM Date: 03/26/18 Baseline Weight: Weight: 32 kg Most recent weight: Weight: 32 kg     Palliative Assessment/Data:   Flowsheet Rows     Most Recent Value  Intake Tab  Referral Department  Hospitalist  Unit at Time of Referral  Med/Surg Unit  Palliative Care Primary Diagnosis  Nephrology  Date Notified  03/27/18  Palliative Care Type  New Palliative care  Reason for referral  Clarify Goals of Care, Psychosocial or Spiritual support  Date of Admission  03/25/18  Date first seen by Palliative Care  03/27/18  # of days Palliative referral response time  0 Day(s)  # of days IP prior to Palliative referral  2  Clinical Assessment  Palliative Performance Scale Score  50%  Pain Max last 24 hours  Not able to report  Pain Min Last 24 hours  Not able to report  Dyspnea Max Last 24 Hours  Not able to report  Dyspnea Min Last 24 hours  Not able to report  Nausea Max Last 24 Hours  Not able to report  Nausea Min Last 24 Hours  Not able to report  Anxiety Max Last 24 Hours  Not able to report  Anxiety Min Last 24 Hours  Not able to report  Other Max Last 24 Hours  Not able to report  Psychosocial & Spiritual Assessment  Palliative Care Outcomes  Patient/Family meeting held?  Yes    Who was at the meeting?  dtr [place], and grand daughter  Palliative Care Outcomes  Provided psychosocial or spiritual support  Palliative Care follow-up planned  Yes, Facility      Time In: 1630 Time Out: 1740 Time Total: 70 min Greater than 50%  of this time was spent counseling and coordinating care related to the above assessment and plan.  Signed by: Dory Horn, NP   Please contact Palliative Medicine Team phone at [phone] for questions and concerns.  For individual provider: See Shea Evans

## 2018-03-27 NOTE — Progress Notes (Signed)
[facility] NEPHROLOGY PROGRESS NOTE  Assessment/ Plan: Pt is a 78 y.o. yo female with hypertension, COPD, lung cancer status post left lobectomy, CAD, CHF, on home oxygen, anemia, transferred from [facility] for AKI.  #Acute kidney injury, nonoliguric: Likely hemodynamically mediated however exact etiology unknown.  Urinalysis with no RBC, no WBC, protein 100.  US renal showed mild bilateral hydronephrosis and increased echogenicity.  Patient received IV fluid overnight with no improvement in renal function.  Creatinine level 7.81, CO2 20 and potassium 3.9 today.  Patient has no uremic symptoms. -I discontinued repeat ultrasound and ordered CT scan to further evaluate the hydronephrosis.  May need urology consult depending on the finding. -Ordered a urine protein creatinine ratio, follow-up kappa lambda ratio. -I have discussed with the patient, her daughter, sister and patient's granddaughter today.  Patient would not want dialysis if creatinine level continued to worsen.  I do not think patient can tolerate long term dialysis.  I recommend palliative care consult to discuss goals of care. -Change IV fluid with sodium bicarbonate, strict ins and out, avoid nephrotoxins including IV contrast.  #Anemia likely due to CKD: Hemoglobin 9.5 iron saturation 4%.  I will order IV iron.  #Hypertension: Blood pressure acceptable.  Currently on metoprolol, isosorbide.  #COPD, lung cancer status post left pneumonectomy, chronic respiratory failure on oxygen  #CHF EF of 60 to 65%.  Looks dry on exam.  #History of CAD: No chest pain.  Subjective: Seen and examined at bedside.  Patient reported her shortness of breath at baseline.  Denied nausea, vomiting, chest pain, dysuria or urgency.  Family members at bedside. Objective Vital signs in last 24 hours: Vitals:   03/27/18 0043 03/27/18 0322 03/27/18 0459 03/27/18 0907  BP:   (!) 145/54 (!) 127/55  Pulse:   99 (!) 102  Resp:    18 18  Temp:   (!) 97.5 F (36.4 C)   TempSrc:   Oral   SpO2: 94% 92% 99% 94%  Weight:       Weight change: -0.024 kg  Intake/Output Summary (Last 24 hours) at 03/27/2018 1117 Last data filed at 03/27/2018 1110 Gross per 24 hour  Intake 1814.36 ml  Output 257 ml  Net 1557.36 ml       Labs: Basic Metabolic Panel: Recent Labs  Lab 03/26/18 0246 03/26/18 0451 03/27/18 0443  NA 137  --  137  K 3.8  --  3.9  CL 101  --  105  CO2 22  --  20*  GLUCOSE 125*  --  112*  BUN 74*  --  99*  CREATININE 7.35*  --  7.81*  CALCIUM 7.7*  --  7.7*  PHOS  --  5.9* 4.9*   Liver Function Tests: Recent Labs  Lab 03/26/18 0246 03/27/18 0443  AST 74*  --   ALT 26  --   ALKPHOS 108  --   BILITOT 0.4  --   PROT 5.9*  --   ALBUMIN 2.5* 2.5*   No results for input(s): LIPASE, AMYLASE in the last 168 hours. No results for input(s): AMMONIA in the last 168 hours. CBC: Recent Labs  Lab 03/26/18 0246  WBC 8.1  NEUTROABS 7.1  HGB 9.5*  HCT 31.1*  MCV 86.9  PLT 137*   Cardiac Enzymes: Recent Labs  Lab 03/26/18 0246  CKTOTAL 264*  TROPONINI 0.03*   CBG: No results for input(s): GLUCAP in the last 168 hours.  Iron Studies:  Recent Labs  03/26/18 0451  IRON 11*  TIBC 279  FERRITIN 199   Studies/Results: US Renal  Result Date: 03/26/2018 CLINICAL DATA:  Acute kidney injury EXAM: RENAL / URINARY TRACT ULTRASOUND COMPLETE COMPARISON:  None. FINDINGS: Right Kidney: Renal measurements: 10 x 4.3 x 5.3 cm = volume: 122 mL. Renal parenchyma is hyperechoic. There is mild hydronephrosis. Left Kidney: Renal measurements: 11.1 x 4.8 x 4.8 cm = volume: 139 mL mL. Echogenic renal parenchyma. Mild hydronephrosis. Bladder: Appears normal for degree of bladder distention. IMPRESSION: Mild bilateral hydronephrosis and hyperechoic renal parenchyma, consistent with chronic medical renal disease. Electronically Signed   By: Ulyses Jarred M.D.   On: 03/26/2018 05:41   Dg Chest Port 1  View  Result Date: 03/26/2018 CLINICAL DATA:  Shortness of breath EXAM: PORTABLE CHEST 1 VIEW COMPARISON:  03/25/2018, 03/21/2018 FINDINGS: Hyperinflated lungs with emphysematous disease. Postsurgical changes in the left lung apex. No pleural effusion or acute consolidation. Stable cardiomediastinal silhouette with aortic atherosclerosis. No pneumothorax. IMPRESSION: No active disease. Hyperinflation with emphysematous disease and left upper postsurgical changes. Electronically Signed   By: Donavan Foil M.D.   On: 03/26/2018 02:29    Medications: Infusions: .  sodium bicarbonate  infusion 1000 mL      Scheduled Medications: . aspirin EC  81 mg Oral Daily  . clopidogrel  75 mg Oral Daily  . ipratropium-albuterol  3 mL Nebulization Q6H  . isosorbide mononitrate  30 mg Oral Daily  . metoprolol tartrate  25 mg Oral BID  . mometasone-formoterol  2 puff Inhalation BID  . rosuvastatin  20 mg Oral QHS    have reviewed scheduled and prn medications.  Physical Exam: General: Elderly, frail looking female, not in distress Heart:RRR, s1s2 nl, no rubs Lungs: No wheezing or crackle Abdomen:soft, Non-tender, non-distended Extremities:No edema Neurology: Alert, awake, nonfocal.  Mazen Marcin Prasad Ebelyn Bohnet 03/27/2018,11:17 AM  LOS: 2 days

## 2018-03-27 NOTE — Progress Notes (Signed)
Patient had a bladder scan and retaining 409 cc.  Order for foley cath . Patient had voided 100 cc and the patient refused to have foley cath. [employer] notified.

## 2018-03-28 DIAGNOSIS — Z7189 Other specified counseling: Secondary | ICD-10-CM

## 2018-03-28 DIAGNOSIS — N1330 Unspecified hydronephrosis: Secondary | ICD-10-CM

## 2018-03-28 DIAGNOSIS — N1339 Other hydronephrosis: Secondary | ICD-10-CM

## 2018-03-28 LAB — CBC WITH DIFFERENTIAL/PLATELET
Abs Immature Granulocytes: 0.12 10*3/uL — ABNORMAL HIGH (ref 0.00–0.07)
Basophils Absolute: 0 10*3/uL (ref 0.0–0.1)
Basophils Relative: 0 %
EOS ABS: 0 10*3/uL (ref 0.0–0.5)
Eosinophils Relative: 0 %
HCT: 25.9 % — ABNORMAL LOW (ref 36.0–46.0)
Hemoglobin: 8 g/dL — ABNORMAL LOW (ref 12.0–15.0)
Immature Granulocytes: 1 %
Lymphocytes Relative: 4 %
Lymphs Abs: 0.4 10*3/uL — ABNORMAL LOW (ref 0.7–4.0)
MCH: 26.7 pg (ref 26.0–34.0)
MCHC: 30.9 g/dL (ref 30.0–36.0)
MCV: 86.3 fL (ref 80.0–100.0)
Monocytes Absolute: 1.2 10*3/uL — ABNORMAL HIGH (ref 0.1–1.0)
Monocytes Relative: 12 %
Neutro Abs: 8.6 10*3/uL — ABNORMAL HIGH (ref 1.7–7.7)
Neutrophils Relative %: 83 %
Platelets: 179 10*3/uL (ref 150–400)
RBC: 3 MIL/uL — ABNORMAL LOW (ref 3.87–5.11)
RDW: 17.8 % — ABNORMAL HIGH (ref 11.5–15.5)
WBC: 10.4 10*3/uL (ref 4.0–10.5)
nRBC: 0 % (ref 0.0–0.2)

## 2018-03-28 LAB — PROTEIN / CREATININE RATIO, URINE
CREATININE, URINE: 67.61 mg/dL
Protein Creatinine Ratio: 2.88 mg/mg{Cre} — ABNORMAL HIGH (ref 0.00–0.15)
TOTAL PROTEIN, URINE: 195 mg/dL

## 2018-03-28 LAB — COMPREHENSIVE METABOLIC PANEL
ALT: 27 U/L (ref 0–44)
ANION GAP: 12 (ref 5–15)
AST: 78 U/L — ABNORMAL HIGH (ref 15–41)
Albumin: 2.3 g/dL — ABNORMAL LOW (ref 3.5–5.0)
Alkaline Phosphatase: 83 U/L (ref 38–126)
BUN: 99 mg/dL — ABNORMAL HIGH (ref 8–23)
CO2: 28 mmol/L (ref 22–32)
Calcium: 7.7 mg/dL — ABNORMAL LOW (ref 8.9–10.3)
Chloride: 99 mmol/L (ref 98–111)
Creatinine, Ser: 8.11 mg/dL — ABNORMAL HIGH (ref 0.44–1.00)
GFR calc Af Amer: 5 mL/min — ABNORMAL LOW (ref >60)
GFR calc non Af Amer: 4 mL/min — ABNORMAL LOW (ref >60)
Glucose, Bld: 167 mg/dL — ABNORMAL HIGH (ref 70–99)
Potassium: 3.5 mmol/L (ref 3.5–5.1)
Sodium: 139 mmol/L (ref 135–145)
Total Bilirubin: 0.6 mg/dL (ref 0.3–1.2)
Total Protein: 5.4 g/dL — ABNORMAL LOW (ref 6.5–8.1)

## 2018-03-28 LAB — CK: CK TOTAL: 310 U/L — AB (ref 38–234)

## 2018-03-28 MED ORDER — TRAZODONE HCL 50 MG PO TABS
50.0000 mg | ORAL_TABLET | Freq: Every evening | ORAL | Status: DC | PRN
  Administered 2018-03-28 – 2018-03-29 (×2): 50 mg via ORAL
  Filled 2018-03-28 (×2): qty 1

## 2018-03-28 MED ORDER — SODIUM CHLORIDE 0.9 % IV SOLN
INTRAVENOUS | Status: DC
  Administered 2018-03-28: 11:00:00 via INTRAVENOUS

## 2018-03-28 MED ORDER — TAMSULOSIN HCL 0.4 MG PO CAPS
0.4000 mg | ORAL_CAPSULE | Freq: Every day | ORAL | Status: DC
  Administered 2018-03-28 – 2018-03-30 (×3): 0.4 mg via ORAL
  Filled 2018-03-28 (×3): qty 1

## 2018-03-28 MED ORDER — SODIUM CHLORIDE 0.9 % IV SOLN: INTRAVENOUS | Status: AC

## 2018-03-28 NOTE — Progress Notes (Signed)
  Patient seen, chart reviewed.  Patient is alert.  She is still visibly short of breath at rest although she is subjectively endorsing less trouble breathing.  She has a family friend, "adopted daughter", Horris Latino, at the bedside.  Spoke to daughter, Alwyn Ren, via conference call  We discussed labs,  creatinine has increased today.  Patient is agreeable for Foley catheter to be placed this morning.  Have spoken to nephrologist, Dr. Carolin Sicks who also joined [place] during this assessment; as well as Dr. Erlinda Hong.  Dr. Carolin Sicks shares that hemodialysis would be too difficult for patient given her underlying frailty; risks would outweigh benefit  Alwyn Ren shares that she and her other siblings spoke to the charge nurse on second shift on 03/27/2018 regarding [place].  I do not see a note to this effect in epic.  Goals of care discussions are ongoing.  Family is aware that she is not a candidate for hemodialysis.  They are hopeful that urology has something to offer to improve her condition and provide quality time.  Alwyn Ren shared with me this morning, pt is full scope of treatment: She would undergo CPR, defibrillation and intubation were her condition to decline.  She would not want a trach or PEG were she unable to come off the ventilator on her own and at that time would pursue comfort measures.  Plan  Per discussions with Dr. Erlinda Hong and Dr. Carolin Sicks, urology consult is being placed Agreeable to Foley catheter being placed this morning Palliative medicine to continue to follow in the hospital and address goals of care as we see how patient does clinically.  Anticipate family meeting later today or tomorrow with daughter Alwyn Ren and other relevant family members  Thank you, Romona Curls, NP Total time: 25 minutes Than 50% of time was spent in counseling and coordination of care

## 2018-03-28 NOTE — Progress Notes (Signed)
PROGRESS NOTE  Rachael Bradley ZOX:[id number] DOB: Jul 15, 1940 DOA: 03/25/2018 PCP: Margy Clarks, NP  HPI/Recap of past 24 hours:  Has urinary retention, refused foley placement, cr worsening  She c/o being swollen   She continue to have poor appetite, no fever,   Assessment/Plan: Principal Problem:   ARF (acute renal failure) (HCC) Active Problems:   CAD (coronary artery disease) of artery bypass graft   Chronic diastolic CHF (congestive heart failure) (HCC)   COPD (chronic obstructive pulmonary disease) (HCC)   Chronic respiratory failure with hypoxia (HCC)   PAD (peripheral artery disease) (HCC)   Anemia of chronic disease   SOB (shortness of breath)   Palliative care by specialist  AKI: Unclear etiology,  work up in progress Does has mild hydronephrosis, urinary retention, start flomax, she is refusing foley placement,urology consulted  spep/light chain study in process, nephrology input appreciated, on bicarb infusion, will decrease rate as patient c/o being swollen Cr worsening, patient overall is very frail, ongoing goals of care discussion , palliative care input appreciated  Normocytic anemia: Likely anemia of chronic disease, no overt bleeding Monitor cbc  COPD with h/o lung cancer s/p lobectomy, chronic hypoxic respiratory failure on home 02 2liters 24/7 Per daughter baseline progressive dyspnea on exertion , needs to stop in the middle walking from one room to another room No wheezing Poor lung reserve, very diminished breath sound overall Continue home meds symbicort, prn nebs  H/o CAD s/p stent, h/o PAD, h/o chf (unknown detail, possible diastolic, currently dry) Denies chest pain Echo lvef 60% On asa/plavix/statin/betablocker  HTN: stable on betablocker and imdur  Code Status: full  Family Communication: patient and daughter who is a Therapist, sports  Disposition Plan: not ready to discharge   Consultants:  Nephrology  Urology  Palliative  care   Procedures:  none  Antibiotics:  none   Objective: BP (!) 158/65 (BP Location: Right Arm)   Pulse 97   Temp 98 F (36.7 C) (Oral)   Resp 15   Ht 4\' 11"  (1.499 m)   Wt 32 kg   SpO2 92%   BMI 14.25 kg/m   Intake/Output Summary (Last 24 hours) at 03/28/2018 0944 Last data filed at 03/28/2018 0736 Gross per 24 hour  Intake 1902.62 ml  Output 300 ml  Net 1602.62 ml   Filed Weights   03/25/18 2327 03/26/18 2014  Weight: 32 kg 32 kg    Exam: Patient is examined daily including today on 03/28/2018, exams remain the same as of yesterday except that has changed    General:  NAD, very frail elderly  Cardiovascular: RRR  Respiratory: lung diminished, no wheezing today  Abdomen: Soft/ND/NT, positive BS  Musculoskeletal: mild generalized Edema  Neuro: alert, oriented   Data Reviewed: Basic Metabolic Panel: Recent Labs  Lab 03/26/18 0246 03/26/18 0451 03/27/18 0443 03/28/18 0401  NA 137  --  137 139  K 3.8  --  3.9 3.5  CL 101  --  105 99  CO2 22  --  20* 28  GLUCOSE 125*  --  112* 167*  BUN 74*  --  99* 99*  CREATININE 7.35*  --  7.81* 8.11*  CALCIUM 7.7*  --  7.7* 7.7*  PHOS  --  5.9* 4.9*  --    Liver Function Tests: Recent Labs  Lab 03/26/18 0246 03/27/18 0443 03/28/18 0401  AST 74*  --  78*  ALT 26  --  27  ALKPHOS 108  --  83  BILITOT 0.4  --  0.6  PROT 5.9*  --  5.4*  ALBUMIN 2.5* 2.5* 2.3*   No results for input(s): LIPASE, AMYLASE in the last 168 hours. No results for input(s): AMMONIA in the last 168 hours. CBC: Recent Labs  Lab 03/26/18 0246 03/28/18 0401  WBC 8.1 10.4  NEUTROABS 7.1 8.6*  HGB 9.5* 8.0*  HCT 31.1* 25.9*  MCV 86.9 86.3  PLT 137* 179   Cardiac Enzymes:   Recent Labs  Lab 03/26/18 0246 03/28/18 0401  CKTOTAL 264* 310*  TROPONINI 0.03*  --    BNP (last 3 results) Recent Labs    03/26/18 0451  BNP 256.5*    ProBNP (last 3 results) No results for input(s): PROBNP in the last 8760  hours.  CBG: No results for input(s): GLUCAP in the last 168 hours.  No results found for this or any previous visit (from the past 240 hour(s)).   Studies: Ct Renal Stone Study  Result Date: 03/27/2018 CLINICAL DATA:  Hydronephrosis. EXAM: CT ABDOMEN AND PELVIS WITHOUT CONTRAST TECHNIQUE: Multidetector CT imaging of the abdomen and pelvis was performed following the standard protocol without IV contrast. COMPARISON:  Bilateral renal ultrasound 03/26/2018 FINDINGS: Lower chest: A small right pleural effusion is present. There is associated atelectasis. Centrilobular emphysema is evident. There is minimal atelectasis at the left base. The heart is enlarged. Coronary artery calcifications are noted. Hepatobiliary: Patient is status post cholecystectomy. Liver is otherwise unremarkable. Common bile duct is within normal limits. Pancreas: Unremarkable. No pancreatic ductal dilatation or surrounding inflammatory changes. 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  No lesions are present. Adrenals/Urinary Tract: Adrenal glands are normal bilaterally. Punctate nonobstructing stone is present at the upper pole of the left kidney. Mild right-sided hydronephrosis is present. There is no left-sided hydronephrosis. No obstructing lesion is present. The ureter is not dilated. The urinary bladder is within normal limits. Stomach/Bowel: The stomach and duodenum are within normal limits. Small bowel is unremarkable. The ascending and transverse colon are normal. Descending and sigmoid colon are within normal limits. Vascular/Lymphatic: Extensive atherosclerotic changes are present in the aorta and branch vessels without aneurysm. No significant adenopathy is present. Reproductive: Uterus and adnexa are within normal limits. Other: A small amount of free fluid is present dependently within the anatomic pelvis. Musculoskeletal: The vertebral body heights and alignment are maintained. No focal lytic or blastic lesions are present.  Pelvis is normal. Hips are located and within normal limits. IMPRESSION: 1. Mild right-sided hydronephrosis. No obstructing lesion present. Right-sided stones. 2. No left-sided hydronephrosis. 3. Punctate nonobstructing stone at the upper pole of the left kidney. 4.  Aortic Atherosclerosis (ICD10-I70.0). 5. Cardiomegaly. 6. Coronary artery disease. 7. Small right pleural effusion and associated atelectasis. 8.  Emphysema (ICD10-J43.9). Electronically Signed   By: San Morelle M.D.   On: 03/27/2018 17:59    Scheduled Meds: . aspirin EC  81 mg Oral Daily  . clopidogrel  75 mg Oral Daily  . ipratropium-albuterol  3 mL Nebulization Q6H  . isosorbide mononitrate  30 mg Oral Daily  . metoprolol tartrate  25 mg Oral BID  . mometasone-formoterol  2 puff Inhalation BID  . rosuvastatin  20 mg Oral QHS  . senna-docusate  1 tablet Oral BID    Continuous Infusions: . ferric gluconate (FERRLECIT/NULECIT) IV Stopped (03/27/18 1322)  .  sodium bicarbonate  infusion 1000 mL 75 mL/hr at 03/27/18 2313     Time spent: 90mins, case discussed with nephrology/urology/palliative care I have personally reviewed and interpreted on  03/28/2018 daily  labs, tele strips, imagings as discussed above under date review session and assessment and plans.  I reviewed all nursing notes, pharmacy notes, consultant notes,  vitals, pertinent old records  I have discussed plan of care as described above with RN , patient and family on 03/28/2018   Florencia Reasons MD, PhD  [facility] Pager [phone]. If 7PM-7AM, please contact night-coverage at [email or web address], password [facility] 03/28/2018, 9:44 AM  LOS: 3 days

## 2018-03-28 NOTE — Progress Notes (Signed)
[facility] NEPHROLOGY PROGRESS NOTE  Assessment/ Plan: Pt is a 78 y.o. yo female with hypertension, COPD, lung cancer status post left lobectomy, CAD, CHF, on home oxygen, anemia, transferred from [facility] for AKI.  #Acute kidney injury, severe: Likely hemodynamically mediated however exact etiology unknown.  Urinalysis with no RBC, no WBC, protein 100.  US renal showed mild bilateral hydronephrosis and increased echogenicity. The CT scan showed mild right-sided hydronephrosis however no obstructing lesion seen concerning for nonobstructive hydronephrosis.  Patient has worsening renal failure.  I recommend urology consult for the evaluation. -Patient had urinary retention, bladder scan with 409 cc of urine yesterday, recommend Foley catheter.  Discussed with the patient and she agreed.   -Urine PCR 2.8, follow-up kappa lambda ratio. -After discussion from yesterday patient would not want dialysis.  I do not think patient is a candidate for long-term dialysis.  I have relayed the information to the patient's daughter today and also discussed with the palliative care team. -Continue IV fluid, discontinue sodium bicarbonate -strict ins and out, avoid nephrotoxins including IV contrast.  #Anemia likely due to CKD: Hemoglobin 9.5 iron saturation 4%.  Ordered IV iron.  #Hypertension: Blood pressure acceptable.  Currently on metoprolol, isosorbide.  #COPD, lung cancer status post left pneumonectomy, chronic respiratory failure on oxygen  #CHF EF of 60 to 65%.  Looks dry on exam.  #History of CAD: No chest pain.  Subjective: Seen and examined at bedside.  CT scan result reviewed with the patient and her daughter over the phone.  Patient denied nausea, vomiting, chest pain.  Now agreed for Foley catheter.    Objective Vital signs in last 24 hours: Vitals:   03/28/18 0136 03/28/18 0512 03/28/18 0814 03/28/18 0854  BP:  132/60  (!) 158/65  Pulse:  77  97  Resp:  14   15  Temp:  97.7 F (36.5 C)  98 F (36.7 C)  TempSrc:  Oral  Oral  SpO2: 96% 96% 97% 92%  Weight:      Height:       Weight change:   Intake/Output Summary (Last 24 hours) at 03/28/2018 1101 Last data filed at 03/28/2018 0736 Gross per 24 hour  Intake 1802.62 ml  Output 300 ml  Net 1502.62 ml       Labs: Basic Metabolic Panel: Recent Labs  Lab 03/26/18 0246 03/26/18 0451 03/27/18 0443 03/28/18 0401  NA 137  --  137 139  K 3.8  --  3.9 3.5  CL 101  --  105 99  CO2 22  --  20* 28  GLUCOSE 125*  --  112* 167*  BUN 74*  --  99* 99*  CREATININE 7.35*  --  7.81* 8.11*  CALCIUM 7.7*  --  7.7* 7.7*  PHOS  --  5.9* 4.9*  --    Liver Function Tests: Recent Labs  Lab 03/26/18 0246 03/27/18 0443 03/28/18 0401  AST 74*  --  78*  ALT 26  --  27  ALKPHOS 108  --  83  BILITOT 0.4  --  0.6  PROT 5.9*  --  5.4*  ALBUMIN 2.5* 2.5* 2.3*   No results for input(s): LIPASE, AMYLASE in the last 168 hours. No results for input(s): AMMONIA in the last 168 hours. CBC: Recent Labs  Lab 03/26/18 0246 03/28/18 0401  WBC 8.1 10.4  NEUTROABS 7.1 8.6*  HGB 9.5* 8.0*  HCT 31.1* 25.9*  MCV 86.9 86.3  PLT 137* 179   Cardiac  Enzymes: Recent Labs  Lab 03/26/18 0246 03/28/18 0401  CKTOTAL 264* 310*  TROPONINI 0.03*  --    CBG: No results for input(s): GLUCAP in the last 168 hours.  Iron Studies:  Recent Labs    03/26/18 0451  IRON 11*  TIBC 279  FERRITIN 199   Studies/Results: Ct Renal Stone Study  Result Date: 03/27/2018 CLINICAL DATA:  Hydronephrosis. EXAM: CT ABDOMEN AND PELVIS WITHOUT CONTRAST TECHNIQUE: Multidetector CT imaging of the abdomen and pelvis was performed following the standard protocol without IV contrast. COMPARISON:  Bilateral renal ultrasound 03/26/2018 FINDINGS: Lower chest: A small right pleural effusion is present. There is associated atelectasis. Centrilobular emphysema is evident. There is minimal atelectasis at the left base. The heart is  enlarged. Coronary artery calcifications are noted. Hepatobiliary: Patient is status post cholecystectomy. Liver is otherwise unremarkable. Common bile duct is within normal limits. Pancreas: Unremarkable. No pancreatic ductal dilatation or surrounding inflammatory changes. 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  No lesions are present. Adrenals/Urinary Tract: Adrenal glands are normal bilaterally. Punctate nonobstructing stone is present at the upper pole of the left kidney. Mild right-sided hydronephrosis is present. There is no left-sided hydronephrosis. No obstructing lesion is present. The ureter is not dilated. The urinary bladder is within normal limits. Stomach/Bowel: The stomach and duodenum are within normal limits. Small bowel is unremarkable. The ascending and transverse colon are normal. Descending and sigmoid colon are within normal limits. Vascular/Lymphatic: Extensive atherosclerotic changes are present in the aorta and branch vessels without aneurysm. No significant adenopathy is present. Reproductive: Uterus and adnexa are within normal limits. Other: A small amount of free fluid is present dependently within the anatomic pelvis. Musculoskeletal: The vertebral body heights and alignment are maintained. No focal lytic or blastic lesions are present. Pelvis is normal. Hips are located and within normal limits. IMPRESSION: 1. Mild right-sided hydronephrosis. No obstructing lesion present. Right-sided stones. 2. No left-sided hydronephrosis. 3. Punctate nonobstructing stone at the upper pole of the left kidney. 4.  Aortic Atherosclerosis (ICD10-I70.0). 5. Cardiomegaly. 6. Coronary artery disease. 7. Small right pleural effusion and associated atelectasis. 8.  Emphysema (ICD10-J43.9). Electronically Signed   By: San Morelle M.D.   On: 03/27/2018 17:59    Medications: Infusions: . ferric gluconate (FERRLECIT/NULECIT) IV Stopped (03/27/18 1322)  .  sodium bicarbonate  infusion 1000 mL 75 mL/hr  at 03/27/18 2313    Scheduled Medications: . aspirin EC  81 mg Oral Daily  . clopidogrel  75 mg Oral Daily  . ipratropium-albuterol  3 mL Nebulization Q6H  . isosorbide mononitrate  30 mg Oral Daily  . metoprolol tartrate  25 mg Oral BID  . mometasone-formoterol  2 puff Inhalation BID  . rosuvastatin  20 mg Oral QHS  . senna-docusate  1 tablet Oral BID  . tamsulosin  0.4 mg Oral Daily    have reviewed scheduled and prn medications.  Physical Exam: General: Elderly, cachectic frail female Heart:RRR, s1s2 nl, no rubs Lungs: Clear  no wheezing or crackles Abdomen:soft, Non-tender, non-distended Extremities: No edema Neurology: Alert, awake, nonfocal.   Tanna Furry 03/28/2018,11:01 AM  LOS: 3 days

## 2018-03-28 NOTE — Consult Note (Signed)
Urology Consult Note   Requesting Attending Physician:  Florencia Reasons, MD Service Providing Consult: Urology  Consulting Attending: Dr. Gloriann Loan   Reason for Consult:  Hydronephrosis, AKI  HPI: Rachael Bradley is seen in consultation for reasons noted above at the request of Florencia Reasons, MD for evaluation of AKI, hydronephrosis.  This is a 78 y.o. female with a history of CAD, CHF, COPD, lung Ca s/p lobectomy, PAD, Significant frailty who was recently admitted to [facility] hospitalist with shortness of breath, poor appetite. During workup, she was noted to be in acute renal failure with a creatinine of 7.45 (off normal baseline in late Feb 2020). Nephrology was consulted. Further workup included CT abd/pelvic imaging (noncon) which revealed very mild right hydronephrosis only, no stones, and a partially distended bladder. She had a bladder scan last night (7pm) that showed ~410cc. She refused a foley catheter at that time.   I personally reviewed CT imaging. There is very mild right hydronephrosis but decompressed ureters bilaterally. No evidence of stones, obstructive lesions or external compressive masses. Her bladder is moderately distended on this film.   On my exam with patient she was sitting quietly on oxygen, minimally conversant but alert. Her daughters were in the room and provided her medical history. She has not record of renal failure or renal issues prior to this. No history of urologic surgeries. Possibly a remote history of gross hematuria but unclear workup. She currently lives with her son, and her daughters check in on her. Her daughters seem to understand patient's current clinical status and overall frail physical health.   Patient now has a foley catheter in place. She has made 200cc UOP today.  Her UA was negative for infection on arrival. It did show +Hgb but negative RBC  Past Medical History: Past Medical History:  Diagnosis Date  . Acid reflux   . CHF (congestive heart failure) ([place])    . Colon polyps   . COPD (chronic obstructive pulmonary disease) ([place])   . Coronary artery disease   . Hypertension   . Lung cancer ([place])   . MI (myocardial infarction) ([place])   . Oxygen dependent    2 lpm via [place],   . Peripheral arterial disease ([place])     Past Surgical History:  Past Surgical History:  Procedure Laterality Date  .  stent    . CARDIAC CATHETERIZATION     with stents,   . CHOLECYSTECTOMY    . LOBECTOMY    . LUNG LOBECTOMY     entire left lung,   . TONSILLECTOMY    . TUBAL LIGATION      Medication: Current Facility-Administered Medications  Medication Dose Route Frequency Provider Last Rate Last Dose  . acetaminophen (TYLENOL) tablet 650 mg  650 mg Oral Q6H PRN Rise Patience, MD   650 mg at 03/27/18 0123   Or  . acetaminophen (TYLENOL) suppository 650 mg  650 mg Rectal Q6H PRN Rise Patience, MD      . albuterol (PROVENTIL) (2.5 MG/3ML) 0.083% nebulizer solution 3 mL  3 mL Inhalation Q6H PRN Florencia Reasons, MD      . aspirin EC tablet 81 mg  81 mg Oral Daily Rise Patience, MD   81 mg at 03/27/18 0947  . clopidogrel (PLAVIX) tablet 75 mg  75 mg Oral Daily Florencia Reasons, MD   75 mg at 03/27/18 0947  . ferric gluconate (NULECIT) 125 mg in sodium chloride 0.9 % 100 mL IVPB  125 mg Intravenous  Daily Rosita Fire, MD   Stopped at 03/27/18 1322  . hydrALAZINE (APRESOLINE) injection 5 mg  5 mg Intravenous Q4H PRN Rise Patience, MD   0.25 mg at 03/26/18 8416  . ipratropium-albuterol (DUONEB) 0.5-2.5 (3) MG/3ML nebulizer solution 3 mL  3 mL Nebulization Q2H PRN Rise Patience, MD   3 mL at 03/26/18 0640  . ipratropium-albuterol (DUONEB) 0.5-2.5 (3) MG/3ML nebulizer solution 3 mL  3 mL Nebulization Q6H Florencia Reasons, MD   3 mL at 03/28/18 0813  . isosorbide mononitrate (IMDUR) 24 hr tablet 30 mg  30 mg Oral Daily Rise Patience, MD   30 mg at 03/27/18 0947  . metoprolol tartrate (LOPRESSOR) tablet 25 mg  25 mg Oral BID Rise Patience, MD    25 mg at 03/27/18 2143  . mometasone-formoterol (DULERA) 200-5 MCG/ACT inhaler 2 puff  2 puff Inhalation BID Rise Patience, MD   2 puff at 03/28/18 0814  . nitroGLYCERIN (NITROSTAT) SL tablet 0.4 mg  0.4 mg Sublingual Q5 min PRN Rise Patience, MD      . ondansetron [facility]) tablet 4 mg  4 mg Oral Q6H PRN Rise Patience, MD       Or  . ondansetron [facility]) injection 4 mg  4 mg Intravenous Q6H PRN Rise Patience, MD      . rosuvastatin (CRESTOR) tablet 20 mg  20 mg Oral QHS Rise Patience, MD   20 mg at 03/27/18 2143  . senna-docusate (Senokot-S) tablet 1 tablet  1 tablet Oral BID Florencia Reasons, MD   1 tablet at 03/27/18 2144  . sodium bicarbonate 150 mEq in dextrose 5 % 1,000 mL infusion   Intravenous Continuous Rosita Fire, MD 75 mL/hr at 03/27/18 2313    . tamsulosin (FLOMAX) capsule 0.4 mg  0.4 mg Oral Daily Florencia Reasons, MD        Allergies: Allergies  Allergen Reactions  . Ambien [Zolpidem] Other (See Comments)    Severe confusion/disorientation   . Lisinopril Cough  . Penicillins Hives and Other (See Comments)    Edema Did it involve swelling of the face/tongue/throat, SOB, or low BP? Yes Did it involve sudden or severe rash/hives, skin peeling, or any reaction on the inside of your mouth or nose? yes Did you need to seek medical attention at a hospital or doctor's office? Yes When did it last happen?many years back If all above answers are "NO", may proceed with cephalosporin use.     Social History: Social History   Tobacco Use  . Smoking status: Former Research scientist (life sciences)  . Smokeless tobacco: Never Used  Substance Use Topics  . Alcohol use: No  . Drug use: No    Family History Family History  Problem Relation Age of Onset  . Stroke Mother   . Hypertension Mother   . Hypertension Father   . Cancer Sister   . Heart failure Sister   . Hypertension Sister     Review of Systems 10 systems were reviewed and are negative except as noted  specifically in the HPI.  Objective   Vital signs in last 24 hours: BP (!) 158/65 (BP Location: Right Arm)   Pulse 97   Temp 98 F (36.7 C) (Oral)   Resp 15   Ht 4\' 11"  (1.499 m)   Wt 32 kg   SpO2 92%   BMI 14.25 kg/m   Physical Exam General: NAD, A&O, resting, appropriate HEENT: [place]/AT, EOMI, MMM Pulmonary: Normal work  of breathing Cardiovascular: HDS, adequate peripheral perfusion Abdomen: Soft, NTTP, nondistended, . GU: foley catheter in place draining light yellow urine Extremities: warm and well perfused Neuro: Appropriate, no focal neurological deficits  Most Recent Labs: Lab Results  Component Value Date   WBC 10.4 03/28/2018   HGB 8.0 (L) 03/28/2018   HCT 25.9 (L) 03/28/2018   PLT 179 03/28/2018    Lab Results  Component Value Date   NA 139 03/28/2018   K 3.5 03/28/2018   CL 99 03/28/2018   CO2 28 03/28/2018   BUN 99 (H) 03/28/2018   CREATININE 8.11 (H) 03/28/2018   CALCIUM 7.7 (L) 03/28/2018   PHOS 4.9 (H) 03/27/2018    Lab Results  Component Value Date   INR 1.03 12/08/2013     IMAGING: Ct Renal Stone Study  Result Date: 03/27/2018 CLINICAL DATA:  Hydronephrosis. EXAM: CT ABDOMEN AND PELVIS WITHOUT CONTRAST TECHNIQUE: Multidetector CT imaging of the abdomen and pelvis was performed following the standard protocol without IV contrast. COMPARISON:  Bilateral renal ultrasound 03/26/2018 FINDINGS: Lower chest: A small right pleural effusion is present. There is associated atelectasis. Centrilobular emphysema is evident. There is minimal atelectasis at the left base. The heart is enlarged. Coronary artery calcifications are noted. Hepatobiliary: Patient is status post cholecystectomy. Liver is otherwise unremarkable. Common bile duct is within normal limits. Pancreas: Unremarkable. No pancreatic ductal dilatation or surrounding inflammatory changes. 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  No lesions are present. Adrenals/Urinary Tract: Adrenal glands are normal  bilaterally. Punctate nonobstructing stone is present at the upper pole of the left kidney. Mild right-sided hydronephrosis is present. There is no left-sided hydronephrosis. No obstructing lesion is present. The ureter is not dilated. The urinary bladder is within normal limits. Stomach/Bowel: The stomach and duodenum are within normal limits. Small bowel is unremarkable. The ascending and transverse colon are normal. Descending and sigmoid colon are within normal limits. Vascular/Lymphatic: Extensive atherosclerotic changes are present in the aorta and branch vessels without aneurysm. No significant adenopathy is present. Reproductive: Uterus and adnexa are within normal limits. Other: A small amount of free fluid is present dependently within the anatomic pelvis. Musculoskeletal: The vertebral body heights and alignment are maintained. No focal lytic or blastic lesions are present. Pelvis is normal. Hips are located and within normal limits. IMPRESSION: 1. Mild right-sided hydronephrosis. No obstructing lesion present. Right-sided stones. 2. No left-sided hydronephrosis. 3. Punctate nonobstructing stone at the upper pole of the left kidney. 4.  Aortic Atherosclerosis (ICD10-I70.0). 5. Cardiomegaly. 6. Coronary artery disease. 7. Small right pleural effusion and associated atelectasis. 8.  Emphysema (ICD10-J43.9). Electronically Signed   By: San Morelle M.D.   On: 03/27/2018 17:59    ------  Assessment:  78 y.o. female with significant medical comorbidity presenting with SOB and acute renal failure. Cross sectional imaging shows mild right hydronephrosis, partially distended bladder in the setting of incomplete emptying (PVR 300-400cc). This degree of renal failure seems unlikely to primarily stem from mild retention, particularly with her fairly benign imaging, although it is possible. Of course, catheter placement at this time is recommended if in alignment with patient's goals of care, as it  would exclude post-renal obstruction from possible etiologies.    Recommendations: - Recommend placement of indwelling urethral catheter. Continue for accurate I&Os while medically necessary   - It does not appear she was in acute urinary retention based on initial output of (<~200cc) - She does not appear to have anatomic reasons for post-renal obstruction. No further urologic  workup or intervention indicated at this time - Thank you for this consult. Urology team would be more than happy to help with any additional questions    Thank you for this consult. Please contact the urology consult pager with any further questions/concerns.

## 2018-03-29 DIAGNOSIS — R627 Adult failure to thrive: Secondary | ICD-10-CM

## 2018-03-29 LAB — RENAL FUNCTION PANEL
Albumin: 2.3 g/dL — ABNORMAL LOW (ref 3.5–5.0)
Anion gap: 12 (ref 5–15)
BUN: 101 mg/dL — ABNORMAL HIGH (ref 8–23)
CO2: 29 mmol/L (ref 22–32)
Calcium: 8 mg/dL — ABNORMAL LOW (ref 8.9–10.3)
Chloride: 99 mmol/L (ref 98–111)
Creatinine, Ser: 8.18 mg/dL — ABNORMAL HIGH (ref 0.44–1.00)
GFR calc Af Amer: 5 mL/min — ABNORMAL LOW (ref >60)
GFR calc non Af Amer: 4 mL/min — ABNORMAL LOW (ref >60)
Glucose, Bld: 140 mg/dL — ABNORMAL HIGH (ref 70–99)
Phosphorus: 4.9 mg/dL — ABNORMAL HIGH (ref 2.5–4.6)
Potassium: 3.5 mmol/L (ref 3.5–5.1)
Sodium: 140 mmol/L (ref 135–145)

## 2018-03-29 NOTE — Progress Notes (Signed)
PROGRESS NOTE  Rachael Bradley MWU:[id number] DOB: 1940-08-18 DOA: 03/25/2018 PCP: Margy Clarks, NP  HPI/Recap of past 24 hours:  She now has foley placed, urine is clear, 900cc urine output since foley placement  cr worsening  She is off ivf due to c/o being swollen   She continue to have poor appetite, no fever,   Daughter at bedside  Assessment/Plan: Principal Problem:   ARF (acute renal failure) ([place]) Active Problems:   CAD (coronary artery disease) of artery bypass graft   Chronic diastolic CHF (congestive heart failure) ([facility])   COPD (chronic obstructive pulmonary disease) ([facility])   Chronic respiratory failure with hypoxia ([facility])   PAD (peripheral artery disease) ([facility])   Anemia of chronic disease   SOB (shortness of breath)   Palliative care by specialist   Goals of care, counseling/discussion   Hydronephrosis  AKI: Unclear etiology,  work up in progress Does has mild hydronephrosis, urinary retention, start flomax, s/p foley placement, urology consulted, input appreciated  spep/light chain study in process, nephrology input appreciated,  Cr worsening, patient overall is very frail, she is not a candidate for dialysis per nephrology, now family desires to bring patient home with home hospice  palliative care input appreciated  Normocytic anemia: Likely anemia of chronic disease, no overt bleeding S/p IV iron  COPD with h/o lung cancer s/p lobectomy, chronic hypoxic respiratory failure on home 02 2liters 24/7 Per daughter baseline progressive dyspnea on exertion , needs to stop in the middle walking from one room to another room No wheezing Poor lung reserve, very diminished breath sound overall Continue home meds symbicort, prn nebs  H/o CAD s/p stent, h/o PAD, h/o chf (unknown detail, possible diastolic, currently dry) Denies chest pain Echo lvef 60% On asa/plavix/statin/betablocker Tele has been unremarkable, will d/c tele  HTN: stable on betablocker and  imdur  Code Status: full  Family Communication: patient and daughter who is a Therapist, sports  Disposition Plan: home with home hospice tomorrow   Consultants:  Nephrology  Urology  Palliative care   Procedures:  none  Antibiotics:  none   Objective: BP (!) 159/57 (BP Location: Right Arm)   Pulse 75   Temp 97.7 F (36.5 C) (Oral)   Resp 17   Ht 4\' 11"  (1.499 m)   Wt 32.9 kg   SpO2 94%   BMI 14.65 kg/m   Intake/Output Summary (Last 24 hours) at 03/29/2018 1707 Last data filed at 03/29/2018 1300 Gross per 24 hour  Intake 1385.84 ml  Output 700 ml  Net 685.84 ml   Filed Weights   03/25/18 2327 03/26/18 2014 03/28/18 2049  Weight: 32 kg 32 kg 32.9 kg    Exam: Patient is examined daily including today on 03/29/2018, exams remain the same as of yesterday except that has changed    General:  NAD, very frail elderly  Cardiovascular: RRR  Respiratory: lung diminished, no wheezing today  Abdomen: Soft/ND/NT, positive BS  Musculoskeletal: mild generalized Edema  Neuro: alert, oriented   Data Reviewed: Basic Metabolic Panel: Recent Labs  Lab 03/26/18 0246 03/26/18 0451 03/27/18 0443 03/28/18 0401 03/29/18 0924  NA 137  --  137 139 140  K 3.8  --  3.9 3.5 3.5  CL 101  --  105 99 99  CO2 22  --  20* 28 29  GLUCOSE 125*  --  112* 167* 140*  BUN 74*  --  99* 99* 101*  CREATININE 7.35*  --  7.81* 8.11* 8.18*  CALCIUM 7.7*  --  7.7* 7.7* 8.0*  PHOS  --  5.9* 4.9*  --  4.9*   Liver Function Tests: Recent Labs  Lab 03/26/18 0246 03/27/18 0443 03/28/18 0401 03/29/18 0924  AST 74*  --  78*  --   ALT 26  --  27  --   ALKPHOS 108  --  83  --   BILITOT 0.4  --  0.6  --   PROT 5.9*  --  5.4*  --   ALBUMIN 2.5* 2.5* 2.3* 2.3*   No results for input(s): LIPASE, AMYLASE in the last 168 hours. No results for input(s): AMMONIA in the last 168 hours. CBC: Recent Labs  Lab 03/26/18 0246 03/28/18 0401  WBC 8.1 10.4  NEUTROABS 7.1 8.6*  HGB 9.5* 8.0*  HCT 31.1*  25.9*  MCV 86.9 86.3  PLT 137* 179   Cardiac Enzymes:   Recent Labs  Lab 03/26/18 0246 03/28/18 0401  CKTOTAL 264* 310*  TROPONINI 0.03*  --    BNP (last 3 results) Recent Labs    03/26/18 0451  BNP 256.5*    ProBNP (last 3 results) No results for input(s): PROBNP in the last 8760 hours.  CBG: No results for input(s): GLUCAP in the last 168 hours.  No results found for this or any previous visit (from the past 240 hour(s)).   Studies: No results found.  Scheduled Meds: . aspirin EC  81 mg Oral Daily  . clopidogrel  75 mg Oral Daily  . ipratropium-albuterol  3 mL Nebulization Q6H  . isosorbide mononitrate  30 mg Oral Daily  . metoprolol tartrate  25 mg Oral BID  . mometasone-formoterol  2 puff Inhalation BID  . rosuvastatin  20 mg Oral QHS  . senna-docusate  1 tablet Oral BID  . tamsulosin  0.4 mg Oral Daily    Continuous Infusions: . ferric gluconate (FERRLECIT/NULECIT) IV 125 mg (03/29/18 1018)     Time spent: 20mins, case discussed with nephrology/urology/palliative care I have personally reviewed and interpreted on  03/29/2018 daily labs, tele strips, imagings as discussed above under date review session and assessment and plans.  I reviewed all nursing notes, pharmacy notes, consultant notes,  vitals, pertinent old records  I have discussed plan of care as described above with RN , patient and family on 03/29/2018   Florencia Reasons MD, PhD  [facility] Pager [phone]. If 7PM-7AM, please contact night-coverage at [email or web address], password [email or web address] 03/29/2018, 5:07 PM  LOS: 4 days

## 2018-03-29 NOTE — Progress Notes (Signed)
[facility] NEPHROLOGY PROGRESS NOTE  Assessment/ Plan: Pt is a 78 y.o. yo female with hypertension, COPD, lung cancer status post left lobectomy, CAD, CHF, on home oxygen, anemia, transferred from [facility] for AKI.  #Acute kidney injury, severe: Likely hemodynamically mediated however exact etiology unknown.  Urinalysis with no RBC, no WBC, protein 100.  US renal showed mild bilateral hydronephrosis and increased echogenicity. The CT scan showed mild right-sided hydronephrosis however no obstructing lesion seen concerning for nonobstructive hydronephrosis.  Seen by urologist. -Patient is nonoliguric, serum creatinine level 8.1.  She has Foley catheter.  I have discussed with the patient and her daughter again today.  She is not a candidate for dialysis therefore I recommended palliative and hospice care. -Urine PCR 2.8, follow-up kappa lambda ratio.  #Anemia likely due to CKD: Hemoglobin 9.5 iron saturation 4%.  Ordered IV iron.  #Hypertension: Blood pressure acceptable.  Currently on metoprolol, isosorbide.  #COPD, lung cancer status post left pneumonectomy, chronic respiratory failure on oxygen  #CHF EF of 60 to 65%.  Looks dry on exam.  #History of CAD: No chest pain.  Patient has poor functional status, frail and multiple comorbidities.  Seen by palliative care and I have been discussing with the patient and multiple family members.  She is a poor candidate for long-term dialysis.  Recommend hospice care.  Discussed with the primary team.  I will sign off, call us with  question.   Subjective: Seen and examined at bedside.  Has Foley catheter.  No new event.  Denied, nausea vomiting chest pain shortness of breath.  Reviewed worsening renal failure and current condition with the patient and her daughter again today.   Objective Vital signs in last 24 hours: Vitals:   03/28/18 1614 03/28/18 2049 03/29/18 0830 03/29/18 0939  BP: 134/63 (!) 145/53  (!)  159/57  Pulse: 78 89  75  Resp: 16 (!) 21  17  Temp: 98.3 F (36.8 C) 98.5 F (36.9 C)  97.7 F (36.5 C)  TempSrc: Oral Oral  Oral  SpO2: 95% 95% 95% 93%  Weight:  32.9 kg    Height:       Weight change:   Intake/Output Summary (Last 24 hours) at 03/29/2018 1211 Last data filed at 03/29/2018 1000 Gross per 24 hour  Intake 1605.84 ml  Output 700 ml  Net 905.84 ml       Labs: Basic Metabolic Panel: Recent Labs  Lab 03/26/18 0451 03/27/18 0443 03/28/18 0401 03/29/18 0924  NA  --  137 139 140  K  --  3.9 3.5 3.5  CL  --  105 99 99  CO2  --  20* 28 29  GLUCOSE  --  112* 167* 140*  BUN  --  99* 99* 101*  CREATININE  --  7.81* 8.11* 8.18*  CALCIUM  --  7.7* 7.7* 8.0*  PHOS 5.9* 4.9*  --  4.9*   Liver Function Tests: Recent Labs  Lab 03/26/18 0246 03/27/18 0443 03/28/18 0401 03/29/18 0924  AST 74*  --  78*  --   ALT 26  --  27  --   ALKPHOS 108  --  83  --   BILITOT 0.4  --  0.6  --   PROT 5.9*  --  5.4*  --   ALBUMIN 2.5* 2.5* 2.3* 2.3*   No results for input(s): LIPASE, AMYLASE in the last 168 hours. No results for input(s): AMMONIA in the last 168 hours. CBC: Recent Labs  Lab 03/26/18 0246 03/28/18 0401  WBC 8.1 10.4  NEUTROABS 7.1 8.6*  HGB 9.5* 8.0*  HCT 31.1* 25.9*  MCV 86.9 86.3  PLT 137* 179   Cardiac Enzymes: Recent Labs  Lab 03/26/18 0246 03/28/18 0401  CKTOTAL 264* 310*  TROPONINI 0.03*  --    CBG: No results for input(s): GLUCAP in the last 168 hours.  Iron Studies:  No results for input(s): IRON, TIBC, TRANSFERRIN, FERRITIN in the last 72 hours. Studies/Results: Ct Renal Stone Study  Result Date: 03/27/2018 CLINICAL DATA:  Hydronephrosis. EXAM: CT ABDOMEN AND PELVIS WITHOUT CONTRAST TECHNIQUE: Multidetector CT imaging of the abdomen and pelvis was performed following the standard protocol without IV contrast. COMPARISON:  Bilateral renal ultrasound 03/26/2018 FINDINGS: Lower chest: A small right pleural effusion is present. There  is associated atelectasis. Centrilobular emphysema is evident. There is minimal atelectasis at the left base. The heart is enlarged. Coronary artery calcifications are noted. Hepatobiliary: Patient is status post cholecystectomy. Liver is otherwise unremarkable. Common bile duct is within normal limits. Pancreas: Unremarkable. No pancreatic ductal dilatation or surrounding inflammatory changes. 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  No lesions are present. Adrenals/Urinary Tract: Adrenal glands are normal bilaterally. Punctate nonobstructing stone is present at the upper pole of the left kidney. Mild right-sided hydronephrosis is present. There is no left-sided hydronephrosis. No obstructing lesion is present. The ureter is not dilated. The urinary bladder is within normal limits. Stomach/Bowel: The stomach and duodenum are within normal limits. Small bowel is unremarkable. The ascending and transverse colon are normal. Descending and sigmoid colon are within normal limits. Vascular/Lymphatic: Extensive atherosclerotic changes are present in the aorta and branch vessels without aneurysm. No significant adenopathy is present. Reproductive: Uterus and adnexa are within normal limits. Other: A small amount of free fluid is present dependently within the anatomic pelvis. Musculoskeletal: The vertebral body heights and alignment are maintained. No focal lytic or blastic lesions are present. Pelvis is normal. Hips are located and within normal limits. IMPRESSION: 1. Mild right-sided hydronephrosis. No obstructing lesion present. Right-sided stones. 2. No left-sided hydronephrosis. 3. Punctate nonobstructing stone at the upper pole of the left kidney. 4.  Aortic Atherosclerosis (ICD10-I70.0). 5. Cardiomegaly. 6. Coronary artery disease. 7. Small right pleural effusion and associated atelectasis. 8.  Emphysema (ICD10-J43.9). Electronically Signed   By: San Morelle M.D.   On: 03/27/2018 17:59     Medications: Infusions: . sodium chloride Stopped (03/29/18 0159)  . ferric gluconate (FERRLECIT/NULECIT) IV 125 mg (03/29/18 1018)    Scheduled Medications: . aspirin EC  81 mg Oral Daily  . clopidogrel  75 mg Oral Daily  . ipratropium-albuterol  3 mL Nebulization Q6H  . isosorbide mononitrate  30 mg Oral Daily  . metoprolol tartrate  25 mg Oral BID  . mometasone-formoterol  2 puff Inhalation BID  . rosuvastatin  20 mg Oral QHS  . senna-docusate  1 tablet Oral BID  . tamsulosin  0.4 mg Oral Daily    have reviewed scheduled and prn medications.  Physical Exam: General: Elderly, cachectic, frail female lying in bed with oxygen Heart:RRR, s1s2 nl, no rubs Lungs: Clear no wheezing or crackles Abdomen:soft, Non-tender, non-distended Extremities: No edema Neurology: Alert, awake, nonfocal. Urology: Has Foley catheter   Tanna Furry 03/29/2018,12:11 PM  LOS: 4 days

## 2018-03-29 NOTE — Progress Notes (Signed)
Urology Progress Note    Subjective: NAEON.  Patient examined again this AM and lab work reviewed Unfortunately creatinine still uptrending with catheter in place.  Cr 8.18 from 8.11 UOP ~400cc overnight   Objective: Vital signs in last 24 hours: Temp:  [97.7 F (36.5 C)-98.5 F (36.9 C)] 97.7 F (36.5 C) (03/08 0939) Pulse Rate:  [75-89] 75 (03/08 0939) Resp:  [16-21] 17 (03/08 0939) BP: (134-159)/(53-63) 159/57 (03/08 0939) SpO2:  [93 %-95 %] 93 % (03/08 0939) Weight:  [32.9 kg] 32.9 kg (03/07 2049)  Intake/Output from previous day: 03/07 0701 - 03/08 0700 In: 1605.8 [P.O.:790; I.V.:705.8; IV Piggyback:110] Out: 600 [Urine:600] Intake/Output this shift: Total I/O In: 150 [P.O.:150] Out: 300 [Urine:300]  Physical Exam:  General: Alert and oriented on oxygen. Appears frail, minimally conversant CV: RRR Lungs: Clear Abdomen: Soft, appropriately tender.  GU: Foley in place draining clear yellow urine Ext: NT, No erythema  Lab Results: Recent Labs    03/28/18 0401  HGB 8.0*  HCT 25.9*   BMET Recent Labs    03/28/18 0401 03/29/18 0924  NA 139 140  K 3.5 3.5  CL 99 99  CO2 28 29  GLUCOSE 167* 140*  BUN 99* 101*  CREATININE 8.11* 8.18*  CALCIUM 7.7* 8.0*     Studies/Results: Ct Renal Stone Study  Result Date: 03/27/2018 CLINICAL DATA:  Hydronephrosis. EXAM: CT ABDOMEN AND PELVIS WITHOUT CONTRAST TECHNIQUE: Multidetector CT imaging of the abdomen and pelvis was performed following the standard protocol without IV contrast. COMPARISON:  Bilateral renal ultrasound 03/26/2018 FINDINGS: Lower chest: A small right pleural effusion is present. There is associated atelectasis. Centrilobular emphysema is evident. There is minimal atelectasis at the left base. The heart is enlarged. Coronary artery calcifications are noted. Hepatobiliary: Patient is status post cholecystectomy. Liver is otherwise unremarkable. Common bile duct is within normal limits. Pancreas:  Unremarkable. No pancreatic ductal dilatation or surrounding inflammatory changes. 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  No lesions are present. Adrenals/Urinary Tract: Adrenal glands are normal bilaterally. Punctate nonobstructing stone is present at the upper pole of the left kidney. Mild right-sided hydronephrosis is present. There is no left-sided hydronephrosis. No obstructing lesion is present. The ureter is not dilated. The urinary bladder is within normal limits. Stomach/Bowel: The stomach and duodenum are within normal limits. Small bowel is unremarkable. The ascending and transverse colon are normal. Descending and sigmoid colon are within normal limits. Vascular/Lymphatic: Extensive atherosclerotic changes are present in the aorta and branch vessels without aneurysm. No significant adenopathy is present. Reproductive: Uterus and adnexa are within normal limits. Other: A small amount of free fluid is present dependently within the anatomic pelvis. Musculoskeletal: The vertebral body heights and alignment are maintained. No focal lytic or blastic lesions are present. Pelvis is normal. Hips are located and within normal limits. IMPRESSION: 1. Mild right-sided hydronephrosis. No obstructing lesion present. Right-sided stones. 2. No left-sided hydronephrosis. 3. Punctate nonobstructing stone at the upper pole of the left kidney. 4.  Aortic Atherosclerosis (ICD10-I70.0). 5. Cardiomegaly. 6. Coronary artery disease. 7. Small right pleural effusion and associated atelectasis. 8.  Emphysema (ICD10-J43.9). Electronically Signed   By: San Morelle M.D.   On: 03/27/2018 17:59    Assessment/Plan:  78 y.o. female with acute renal insufficiency, mild urinary retention with right hydronephrosis. Catheter placed 03/28/18 although renal function continues to deteriorate.   - Continue foley catheter. Would leave catheter removal up to primary team and patient/family's wishes - Clinical picture does not point  towards predominant post-renal  process - Urology will plan to sign off at this time. Please do not hesitate to contact with any additional questions or concerns   Dispo: floor   LOS: 4 days   Fredricka Bonine 03/29/2018, 11:53 AM

## 2018-03-29 NOTE — Progress Notes (Signed)
  Patient seen, chart reviewed.  Patient is alert, up in bed eating breakfast.  Respirations appear slightly less labored this morning  Patient and I began to discuss her diagnosis.  A.m. labs have not been drawn yet.  She was seen by urology yesterday and they have already signed off.  She feels that at this point everything is in God's hands.  She recognizes that this could be end-of-life for her.  I asked her if she was afraid and she said no.  She states she just wants to be with her family and be comfortable  I have been speaking to Mrs. Chenette and multiple family members about my concerns for patient remaining a full code in the setting of her history of lung cancer, complete left lobectomy and now renal failure.  At this point her daughter Kennyth Lose, who is a Marine scientist, entered the room and we continued this conversation.  I also met Kennyth Lose in the hall and spoke candidly that I would recommend a DNR and she concurred with that but states she has to talk to her other siblings.  Mrs. Novack has a very large family: 5 children, 8 siblings as well as multiple grandchildren, great-grandchildren that are all actively involved in her care.  I also talked to [place] about accessing hospice.  I explained to her how she can self refer if that is not been done or decided upon before discharge.  Plan Palliative medicine to continue support patient and family during this hospitalization Recommended to patient and patient's daughter Kennyth Lose, that patient be a DNR.  Stressed that this does not mean do not treat Also began discussions regarding hospice support in the home; how to access her services as well as specifically what they have to offer specially in terms of patient's goals to be with her family, at home, with focus on comfort They understand that she is not a hemodialysis candidate  Family continues to be undecided about goals of care and states they are having ongoing conversation amongst themselves  Thank  you, Romona Curls, NP Total time: 25 minutes Greater than 50% of time was spent in counseling and coordination of care

## 2018-03-30 DIAGNOSIS — J431 Panlobular emphysema: Secondary | ICD-10-CM

## 2018-03-30 DIAGNOSIS — I25700 Atherosclerosis of coronary artery bypass graft(s), unspecified, with unstable angina pectoris: Secondary | ICD-10-CM

## 2018-03-30 LAB — RENAL FUNCTION PANEL
Albumin: 2.5 g/dL — ABNORMAL LOW (ref 3.5–5.0)
Anion gap: 12 (ref 5–15)
BUN: 105 mg/dL — ABNORMAL HIGH (ref 8–23)
CO2: 28 mmol/L (ref 22–32)
Calcium: 8.2 mg/dL — ABNORMAL LOW (ref 8.9–10.3)
Chloride: 99 mmol/L (ref 98–111)
Creatinine, Ser: 8.81 mg/dL — ABNORMAL HIGH (ref 0.44–1.00)
GFR calc non Af Amer: 4 mL/min — ABNORMAL LOW (ref >60)
GFR, EST AFRICAN AMERICAN: 5 mL/min — AB (ref >60)
Glucose, Bld: 105 mg/dL — ABNORMAL HIGH (ref 70–99)
PHOSPHORUS: 5.6 mg/dL — AB (ref 2.5–4.6)
Potassium: 3.4 mmol/L — ABNORMAL LOW (ref 3.5–5.1)
Sodium: 139 mmol/L (ref 135–145)

## 2018-03-30 LAB — PROTEIN ELECTROPHORESIS, SERUM
A/G Ratio: 1.2 (ref 0.7–1.7)
Albumin ELP: 2.9 g/dL (ref 2.9–4.4)
Alpha-1-Globulin: 0.4 g/dL (ref 0.0–0.4)
Alpha-2-Globulin: 0.9 g/dL (ref 0.4–1.0)
Beta Globulin: 0.7 g/dL (ref 0.7–1.3)
Gamma Globulin: 0.5 g/dL (ref 0.4–1.8)
Globulin, Total: 2.5 g/dL (ref 2.2–3.9)
Total Protein ELP: 5.4 g/dL — ABNORMAL LOW (ref 6.0–8.5)

## 2018-03-30 LAB — KAPPA/LAMBDA LIGHT CHAINS
Kappa free light chain: 122.9 mg/L — ABNORMAL HIGH (ref 3.3–19.4)
Kappa, lambda light chain ratio: 1.8 — ABNORMAL HIGH (ref 0.26–1.65)
Lambda free light chains: 68.2 mg/L — ABNORMAL HIGH (ref 5.7–26.3)

## 2018-03-30 MED ORDER — HALOPERIDOL 0.5 MG PO TABS
0.5000 mg | ORAL_TABLET | ORAL | Status: DC | PRN
  Filled 2018-03-30: qty 1

## 2018-03-30 MED ORDER — POLYVINYL ALCOHOL 1.4 % OP SOLN
1.0000 [drp] | Freq: Four times a day (QID) | OPHTHALMIC | Status: DC | PRN
  Filled 2018-03-30: qty 15

## 2018-03-30 MED ORDER — LORAZEPAM 0.5 MG PO TABS
0.2500 mg | ORAL_TABLET | ORAL | Status: DC | PRN
  Administered 2018-03-30: 0.25 mg via ORAL
  Filled 2018-03-30: qty 1

## 2018-03-30 MED ORDER — HYDROMORPHONE HCL 1 MG/ML IJ SOLN
0.2500 mg | INTRAMUSCULAR | Status: DC | PRN
  Administered 2018-03-30: 0.25 mg via INTRAVENOUS
  Filled 2018-03-30: qty 1

## 2018-03-30 MED ORDER — HALOPERIDOL LACTATE 5 MG/ML IJ SOLN: 0.5000 mg | INTRAMUSCULAR | Status: DC | PRN

## 2018-03-30 MED ORDER — LORAZEPAM 2 MG/ML PO CONC: 0.2500 mg | ORAL | Status: DC | PRN

## 2018-03-30 MED ORDER — HYDROMORPHONE HCL 2 MG PO TABS: 1.0000 mg | ORAL_TABLET | ORAL | Status: DC | PRN

## 2018-03-30 MED ORDER — ATROPINE SULFATE 1 % OP SOLN
4.0000 [drp] | OPHTHALMIC | Status: DC | PRN
  Filled 2018-03-30: qty 2

## 2018-03-30 MED ORDER — SODIUM CHLORIDE 0.9% FLUSH: 3.0000 mL | INTRAVENOUS | Status: DC | PRN

## 2018-03-30 MED ORDER — LORAZEPAM 2 MG/ML IJ SOLN: 0.2500 mg | INTRAMUSCULAR | Status: DC | PRN

## 2018-03-30 MED ORDER — SODIUM CHLORIDE 0.9 % IV SOLN: 250.0000 mL | INTRAVENOUS | Status: DC | PRN

## 2018-03-30 MED ORDER — HALOPERIDOL LACTATE 2 MG/ML PO CONC
0.5000 mg | ORAL | Status: DC | PRN
  Filled 2018-03-30: qty 0.3

## 2018-03-30 MED ORDER — METHYLPREDNISOLONE SODIUM SUCC 40 MG IJ SOLR
40.0000 mg | Freq: Once | INTRAMUSCULAR | Status: AC
  Administered 2018-03-30: 40 mg via INTRAVENOUS
  Filled 2018-03-30: qty 1

## 2018-03-30 MED ORDER — SODIUM CHLORIDE 0.9% FLUSH
3.0000 mL | Freq: Two times a day (BID) | INTRAVENOUS | Status: DC
  Administered 2018-03-30: 3 mL via INTRAVENOUS

## 2018-03-30 NOTE — Progress Notes (Signed)
Report given to Helene Kelp, Therapist, sports at [facility]. IV was taken out, and AVS printed and given to patient. Patient belongings were sent with patient, and family.   Farley Ly RN

## 2018-03-30 NOTE — Progress Notes (Signed)
   03/30/18 1100  Clinical Encounter Type  Visited With Patient and family together  Visit Type Spiritual support  Referral From Nurse  Responded to [facility] consult for support. Patient was getting cereal with family at bedside. Daughter welcomed prayer for her mother. Prayed with family and will follow up at family request.

## 2018-03-30 NOTE — Progress Notes (Signed)
Manufacturing engineer [facility]) Hospital Liaison note.   Received request from Crawford Givens, [place] for family interest in [facility] with request for transfer today. Chart reviewed and eligibility confirmed. Met with  patient and family to confirm interest and explain services. Family agreeable to transfer today. CSW aware.  Registration paper work completed. Dr. Orpah Melter to assume care per family request.   Please fax discharge summary to [phone]. RN please call report to [phone]. Please arrange transport for patient.   Thank you,      Farrel Gordon, RN, [facility]   [place]     [place] are on AMION

## 2018-03-30 NOTE — Clinical Social Work Note (Signed)
Patient discharging to [facility] residential hospice facility today, transported by ambulance. CSW talked with daughter Alwyn Ren earlier today and decision was made for residential hospice in [place] (patient and family live in [place], [place]). Discharge summary transmitted. Once transport called, CSW visited room and spoke with female who identified herself as "like a daughter" to patient. She advised CSW that she will contact daughter regarding approximate arrival time of ambulance. CSW signing off as no other CSW intervention services needed.  Jahara Dail Givens, MSW, LCSW Licensed Clinical Social Worker [place] [phone]

## 2018-03-30 NOTE — Discharge Summary (Signed)
Discharge Summary  Rachael Bradley SKA:[id number] DOB: 06/01/1940  PCP: Margy Clarks, NP  Admit date: 03/25/2018 Discharge date: 03/30/2018  Time spent: 24mins, more than 50% time spent on coordination of care.  Patient is discharge to residential hospice   Discharge Diagnoses:  Active Hospital Problems   Diagnosis Date Noted  . ARF (acute renal failure) ([place]) 03/26/2018  . FTT (failure to thrive) in adult   . Goals of care, counseling/discussion   . Hydronephrosis   . Chronic respiratory failure with hypoxia ([place])   . PAD (peripheral artery disease) ([place])   . Anemia of chronic disease   . SOB (shortness of breath)   . Palliative care by specialist   . COPD (chronic obstructive pulmonary disease) ([place]) 03/26/2018  . Chronic diastolic CHF (congestive heart failure) ([place])   . CAD (coronary artery disease) of artery bypass graft 04/03/2013    Resolved Hospital Problems  No resolved problems to display.    Discharge Condition: stable  Diet recommendation: as tolerated   Filed Weights   03/26/18 2014 03/28/18 2049 03/29/18 2046  Weight: 32 kg 32.9 kg 33.1 kg    History of present illness: (pe admitting MD DR Hal Hope)  PCP: Normand Sloop, MD  Patient coming from: Home.  Patient was transferred from [facility].  Chief Complaint: Shortness of breath.  HPI: Rachael Bradley is a 78 y.o. female with history of CAD status post stenting, diastolic dysfunction per 2D echo in 2015, hypertension, COPD on home oxygen, anemia and previous history of tobacco abuse had presented to the ER at [facility] with complaint of shortness of breath.  Denies any chest pain productive cough fever or chills.  Shortness of breath is present even at rest and increased on exertion.  Patient was found to be wheezing and was given nebulizer treatment.  Had come up with similar symptoms 5 days ago and at that time was treated empirically.  Noted in the labs were patient's creatinine is  markedly increased from February 29 when patient had come.  On March 21, 2018 patient's creatinine was normal.  And now it was 7.45.  BUN of 70 anion gap of 21 sodium 134 chloride 68 ABG done showed pH of 7.32 PCO2 of 62.  Patient denies taking any NSAIDs or any diuretics since her last visit in the ER 5 days ago.  Denies nausea vomiting abdominal pain diarrhea fever chills.  In the ER patient was given 1 L fluid bolus.  And since there was no nephrologist was transferred to [facility].  On my exam patient is not in distress.  Able to make urine.  UA is pending.  Patient admitted for acute renal failure and shortness of breath.  ED Course: Patient with a direct admit.  Hospital Course:  Principal Problem:   ARF (acute renal failure) (HCC) Active Problems:   CAD (coronary artery disease) of artery bypass graft   Chronic diastolic CHF (congestive heart failure) (HCC)   COPD (chronic obstructive pulmonary disease) (HCC)   Chronic respiratory failure with hypoxia (HCC)   PAD (peripheral artery disease) (HCC)   Anemia of chronic disease   SOB (shortness of breath)   Palliative care by specialist   Goals of care, counseling/discussion   Hydronephrosis   FTT (failure to thrive) in adult   AKI:  Unclear etiology, cr 7.35 on presentation, Does has mild hydronephrosis, urinary retention, started flomax, s/p foley placement, urology consulted, input appreciated  spep/light chain study in process, nephrology input appreciated,  She received hydration, hydration discontinued due to c/o being edematous Cr worsening despite above intervention, patient overall is very frail, she is not a candidate for dialysis per nephrology, now family desires to transition to comfort measures, and residential hospice discharge.   palliative care /hospice input appreciated  Normocytic anemia: Likely anemia of chronic disease, no overt bleeding S/p IV iron  COPD with h/o lung cancer s/p lobectomy,  chronic hypoxic respiratory failure on home 02 2liters 24/7 Per daughter baseline progressive dyspnea on exertion , needs to stop in the middle walking from one room to another room prior to coming to the hospital No wheezing on xeam, but Poor lung reserve, very diminished breath sound overall Continue prn nebs, continue home 02 She started to have hemoptysis on day of discharge to residential hospice, asa/plavix discontinued.  H/o CAD s/p stent, h/o PAD, h/o chf (unknown detail, possible diastolic, currently dry) Denies chest pain Echo lvef 60% Previously on asa/plavix/statin/betablocker Asa/plavix stopped due to hemoptysis  HTN: stable on betablocker and imdur  Code Status: DNR  Family Communication: patient and daughter who is a Therapist, sports  Disposition Plan: residential hospice    Consultants:  Nephrology  Urology  Palliative care/hospice    Procedures:  Foley insertion   Antibiotics:  none   Discharge Exam: BP 132/74 (BP Location: Left Arm)   Pulse 98   Temp 98.1 F (36.7 C) (Oral)   Resp (!) 22   Ht 4\' 11"  (1.499 m)   Wt 33.1 kg   SpO2 100%   BMI 14.74 kg/m   General: frail, aaox3 Cardiovascular: RRR Respiratory: overall diminished     Discharge Instructions    Diet general   Complete by:  As directed    Increase activity slowly   Complete by:  As directed      Allergies as of 03/30/2018      Reactions   Ambien [zolpidem] Other (See Comments)   Severe confusion/disorientation    Lisinopril Cough   Penicillins Hives, Other (See Comments)   Edema Did it involve swelling of the face/tongue/throat, SOB, or low BP? Yes Did it involve sudden or severe rash/hives, skin peeling, or any reaction on the inside of your mouth or nose? yes Did you need to seek medical attention at a hospital or doctor's office? Yes When did it last happen?many years back If all above answers are "NO", may proceed with cephalosporin use.      Medication List      STOP taking these medications   acetaminophen 500 MG tablet Commonly known as:  TYLENOL   albuterol 108 (90 Base) MCG/ACT inhaler Commonly known as:  PROVENTIL HFA;VENTOLIN HFA   alum & mag hydroxide-simeth 200-200-20 MG/5ML suspension Commonly known as:  MAALOX/MYLANTA   aspirin EC 81 MG tablet   budesonide-formoterol 160-4.5 MCG/ACT inhaler Commonly known as:  SYMBICORT   clopidogrel 75 MG tablet Commonly known as:  PLAVIX   furosemide 20 MG tablet Commonly known as:  LASIX   ipratropium 0.02 % nebulizer solution Commonly known as:  ATROVENT   omeprazole 20 MG tablet Commonly known as:  PRILOSEC OTC   polyethylene glycol powder powder Commonly known as:  GLYCOLAX/MIRALAX   rosuvastatin 20 MG tablet Commonly known as:  CRESTOR     TAKE these medications   isosorbide mononitrate 30 MG 24 hr tablet Commonly known as:  IMDUR Take 15 mg by mouth 2 (two) times daily.   metoprolol tartrate 25 MG tablet Commonly known as:  LOPRESSOR Take 1 tablet (  25 mg total) by mouth 2 (two) times daily.   nitroGLYCERIN 0.4 MG SL tablet Commonly known as:  NITROSTAT Place 0.4 mg under the tongue every 5 (five) minutes as needed for chest pain.      Allergies  Allergen Reactions  . Ambien [Zolpidem] Other (See Comments)    Severe confusion/disorientation   . Lisinopril Cough  . Penicillins Hives and Other (See Comments)    Edema Did it involve swelling of the face/tongue/throat, SOB, or low BP? Yes Did it involve sudden or severe rash/hives, skin peeling, or any reaction on the inside of your mouth or nose? yes Did you need to seek medical attention at a hospital or doctor's office? Yes When did it last happen?many years back If all above answers are "NO", may proceed with cephalosporin use.       The results of significant diagnostics from this hospitalization (including imaging, microbiology, ancillary and laboratory) are listed below for reference.     Significant Diagnostic Studies: US Renal  Result Date: 03/26/2018 CLINICAL DATA:  Acute kidney injury EXAM: RENAL / URINARY TRACT ULTRASOUND COMPLETE COMPARISON:  None. FINDINGS: Right Kidney: Renal measurements: 10 x 4.3 x 5.3 cm = volume: 122 mL. Renal parenchyma is hyperechoic. There is mild hydronephrosis. Left Kidney: Renal measurements: 11.1 x 4.8 x 4.8 cm = volume: 139 mL mL. Echogenic renal parenchyma. Mild hydronephrosis. Bladder: Appears normal for degree of bladder distention. IMPRESSION: Mild bilateral hydronephrosis and hyperechoic renal parenchyma, consistent with chronic medical renal disease. Electronically Signed   By: Ulyses Jarred M.D.   On: 03/26/2018 05:41   Dg Chest Port 1 View  Result Date: 03/26/2018 CLINICAL DATA:  Shortness of breath EXAM: PORTABLE CHEST 1 VIEW COMPARISON:  03/25/2018, 03/21/2018 FINDINGS: Hyperinflated lungs with emphysematous disease. Postsurgical changes in the left lung apex. No pleural effusion or acute consolidation. Stable cardiomediastinal silhouette with aortic atherosclerosis. No pneumothorax. IMPRESSION: No active disease. Hyperinflation with emphysematous disease and left upper postsurgical changes. Electronically Signed   By: Donavan Foil M.D.   On: 03/26/2018 02:29   Ct Renal Stone Study  Result Date: 03/27/2018 CLINICAL DATA:  Hydronephrosis. EXAM: CT ABDOMEN AND PELVIS WITHOUT CONTRAST TECHNIQUE: Multidetector CT imaging of the abdomen and pelvis was performed following the standard protocol without IV contrast. COMPARISON:  Bilateral renal ultrasound 03/26/2018 FINDINGS: Lower chest: A small right pleural effusion is present. There is associated atelectasis. Centrilobular emphysema is evident. There is minimal atelectasis at the left base. The heart is enlarged. Coronary artery calcifications are noted. Hepatobiliary: Patient is status post cholecystectomy. Liver is otherwise unremarkable. Common bile duct is within normal limits. Pancreas:  Unremarkable. No pancreatic ductal dilatation or surrounding inflammatory changes. 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  No lesions are present. Adrenals/Urinary Tract: Adrenal glands are normal bilaterally. Punctate nonobstructing stone is present at the upper pole of the left kidney. Mild right-sided hydronephrosis is present. There is no left-sided hydronephrosis. No obstructing lesion is present. The ureter is not dilated. The urinary bladder is within normal limits. Stomach/Bowel: The stomach and duodenum are within normal limits. Small bowel is unremarkable. The ascending and transverse colon are normal. Descending and sigmoid colon are within normal limits. Vascular/Lymphatic: Extensive atherosclerotic changes are present in the aorta and branch vessels without aneurysm. No significant adenopathy is present. Reproductive: Uterus and adnexa are within normal limits. Other: A small amount of free fluid is present dependently within the anatomic pelvis. Musculoskeletal: The vertebral body heights and alignment are maintained. No focal lytic or blastic  lesions are present. Pelvis is normal. Hips are located and within normal limits. IMPRESSION: 1. Mild right-sided hydronephrosis. No obstructing lesion present. Right-sided stones. 2. No left-sided hydronephrosis. 3. Punctate nonobstructing stone at the upper pole of the left kidney. 4.  Aortic Atherosclerosis (ICD10-I70.0). 5. Cardiomegaly. 6. Coronary artery disease. 7. Small right pleural effusion and associated atelectasis. 8.  Emphysema (ICD10-J43.9). Electronically Signed   By: San Morelle M.D.   On: 03/27/2018 17:59    Microbiology: No results found for this or any previous visit (from the past 240 hour(s)).   Labs: Basic Metabolic Panel: Recent Labs  Lab 03/26/18 0246 03/26/18 0451 03/27/18 0443 03/28/18 0401 03/29/18 0924 03/30/18 0535  NA 137  --  137 139 140 139  K 3.8  --  3.9 3.5 3.5 3.4*  CL 101  --  105 99 99 99  CO2 22  --   20* 28 29 28   GLUCOSE 125*  --  112* 167* 140* 105*  BUN 74*  --  99* 99* 101* 105*  CREATININE 7.35*  --  7.81* 8.11* 8.18* 8.81*  CALCIUM 7.7*  --  7.7* 7.7* 8.0* 8.2*  PHOS  --  5.9* 4.9*  --  4.9* 5.6*   Liver Function Tests: Recent Labs  Lab 03/26/18 0246 03/27/18 0443 03/28/18 0401 03/29/18 0924 03/30/18 0535  AST 74*  --  78*  --   --   ALT 26  --  27  --   --   ALKPHOS 108  --  83  --   --   BILITOT 0.4  --  0.6  --   --   PROT 5.9*  --  5.4*  --   --   ALBUMIN 2.5* 2.5* 2.3* 2.3* 2.5*   No results for input(s): LIPASE, AMYLASE in the last 168 hours. No results for input(s): AMMONIA in the last 168 hours. CBC: Recent Labs  Lab 03/26/18 0246 03/28/18 0401  WBC 8.1 10.4  NEUTROABS 7.1 8.6*  HGB 9.5* 8.0*  HCT 31.1* 25.9*  MCV 86.9 86.3  PLT 137* 179   Cardiac Enzymes: Recent Labs  Lab 03/26/18 0246 03/28/18 0401  CKTOTAL 264* 310*  TROPONINI 0.03*  --    BNP: BNP (last 3 results) Recent Labs    03/26/18 0451  BNP 256.5*    ProBNP (last 3 results) No results for input(s): PROBNP in the last 8760 hours.  CBG: No results for input(s): GLUCAP in the last 168 hours.     Signed:  Florencia Reasons MD, PhD  [facility] 03/30/2018, 1:47 PM

## 2018-03-30 NOTE — Progress Notes (Signed)
Daily Progress Note   Patient Name: Rachael Bradley       Date: 03/30/2018 DOB: 09/24/1940  Age: 78 y.o. MRN#: [id number] Attending Physician: Florencia Reasons, MD Primary Care Physician: Margy Clarks, NP Admit Date: 03/25/2018  Reason for Consultation/Follow-up: Establishing goals of care, Interfamily conflict, Pain control, Psychosocial/spiritual support and Terminal Care  Subjective: Patient complaining of generalized pain.  "I feel bad".   Has had difficulties with angina and SOB.  This morning had hemoptysis.  Renal failure not improving. GFR 5.    Dtrs Catina and Kennyth Lose consent to DNR and request hospice house care.  We had a long discussion about which geographic location ([place] vs [place]) to go to.  We talked at length about symptom control.   Assessment: Very pleasant supportive family.  Patient with ESRD not a dialysis candidate.  History of CAD with on-going angina and lung cancer/COPD with episodes of respiratory distress.  Now on 6L.  Taking sips/bites.   Patient Profile/HPI:  78 y.o. female  with past medical history of lung cancer 2002 status post complete left lobectomy, AVM duodenum, anemia, COPD on home O2, hypertension, diastolic heart failure, history of MI, coronary artery disease status post stenting admitted on 03/25/2018 with acute shortness of breath.  Patient was seen at [facility] in [place] with complaints of shortness of breath approximately 5 days prior to admission to [facility] on 03/25/2018.  At that time her creatinine level on 2/29 was WNL.  Upon admission.  Patient's creatinine was 7.45.  Renal ultrasound performed and resulted mild bilateral hydronephrosis.    Length of Stay: 5  Current Medications: Scheduled Meds:  . ipratropium-albuterol  3 mL Nebulization Q6H  .  isosorbide mononitrate  30 mg Oral Daily  . methylPREDNISolone (SOLU-MEDROL) injection  40 mg Intravenous Once  . metoprolol tartrate  25 mg Oral BID  . mometasone-formoterol  2 puff Inhalation BID  . senna-docusate  1 tablet Oral BID  . sodium chloride flush  3 mL Intravenous Q12H    Continuous Infusions: . sodium chloride      PRN Meds: sodium chloride, acetaminophen **OR** acetaminophen, albuterol, atropine, haloperidol **OR** haloperidol **OR** haloperidol lactate, hydrALAZINE, ipratropium-albuterol, LORazepam **OR** LORazepam **OR** LORazepam, nitroGLYCERIN, ondansetron **OR** ondansetron (ZOFRAN) IV, polyvinyl alcohol, sodium chloride flush, traZODone  Physical Exam  Thin frail elderly female, sitting up.  Trembling.  States that she is generally feeling very bad. CV rrr with murmur resp on 6L no distress.  Decreased breath sounds abd soft, nt, nd Lower ext without edema.  Vital Signs: BP 132/74 (BP Location: Left Arm)   Pulse 98   Temp 98.1 F (36.7 C) (Oral)   Resp (!) 22   Ht 4\' 11"  (1.499 m)   Wt 33.1 kg   SpO2 100%   BMI 14.74 kg/m  SpO2: SpO2: 100 % O2 Device: O2 Device: Nasal Cannula O2 Flow Rate: O2 Flow Rate (L/min): 4 L/min  Intake/output summary:   Intake/Output Summary (Last 24 hours) at 03/30/2018 1120 Last data filed at 03/30/2018 1112 Gross per 24 hour  Intake 300 ml  Output 652 ml  Net -352 ml   LBM: Last BM Date: 03/29/18 Baseline Weight: Weight: 32 kg Most recent weight: Weight: 33.1 kg       Palliative Assessment/Data: 20%      Patient Active Problem List   Diagnosis Date Noted  . FTT (failure to thrive) in adult   . Goals of care, counseling/discussion   . Hydronephrosis   . Chronic respiratory failure with hypoxia ([place])   . PAD (peripheral artery disease) ([place])   . Anemia of chronic disease   . SOB (shortness of breath)   . Palliative care by specialist   . ARF (acute renal failure) ([place]) 03/26/2018  . COPD (chronic  obstructive pulmonary disease) ([place]) 03/26/2018  . Acute sinusitis 12/08/2013  . NSTEMI (non-ST elevated myocardial infarction) ([place]) 12/08/2013  . Acute exacerbation of chronic obstructive pulmonary disease (COPD) ([place]) 12/08/2013  . Chronic diastolic CHF (congestive heart failure) ([place])   . Coronary arteriosclerosis in native artery   . COPD with acute exacerbation ([place]) 04/03/2013  . COPD exacerbation ([place]) 04/03/2013  . Chest pain 04/03/2013  . CAD (coronary artery disease) of artery bypass graft 04/03/2013  . Lung cancer ([place]) 04/03/2013    Palliative Care Plan    Recommendations/Plan:  Will change orders to DNR, comfort care.  Will d/c interventions not associated with comfort care.  Family requests bed at hospice house.  CSW already actively helping family.  Goals of Care and Additional Recommendations:  Limitations on Scope of Treatment: Full Comfort Care  Code Status:  DNR  Prognosis:   < 2 weeks   Discharge Planning:  Hospice facility  Care plan was discussed with  Attending MD,  Bedside RN, CSW, family, PMT team  Thank you for allowing the Palliative Medicine Team to assist in the care of this patient.  Total time spent:  60 min.     Greater than 50%  of this time was spent counseling and coordinating care related to the above assessment and plan.  Florentina Jenny, PA-C Palliative Medicine  Please contact Palliative MedicineTeam phone at [phone] for questions and concerns between 7 am - 7 pm.   Please see AMION for individual provider pager numbers.

## 2018-04-22 DEATH — deceased

## 2020-12-31 IMAGING — DX DG CHEST 1V PORT
1 series · 1 of 1 positions shown · non-contrast
Comparison: 03/25/2018, 03/21/2018

CLINICAL DATA: Shortness of breath

EXAM:
PORTABLE CHEST 1 VIEW

[chest]
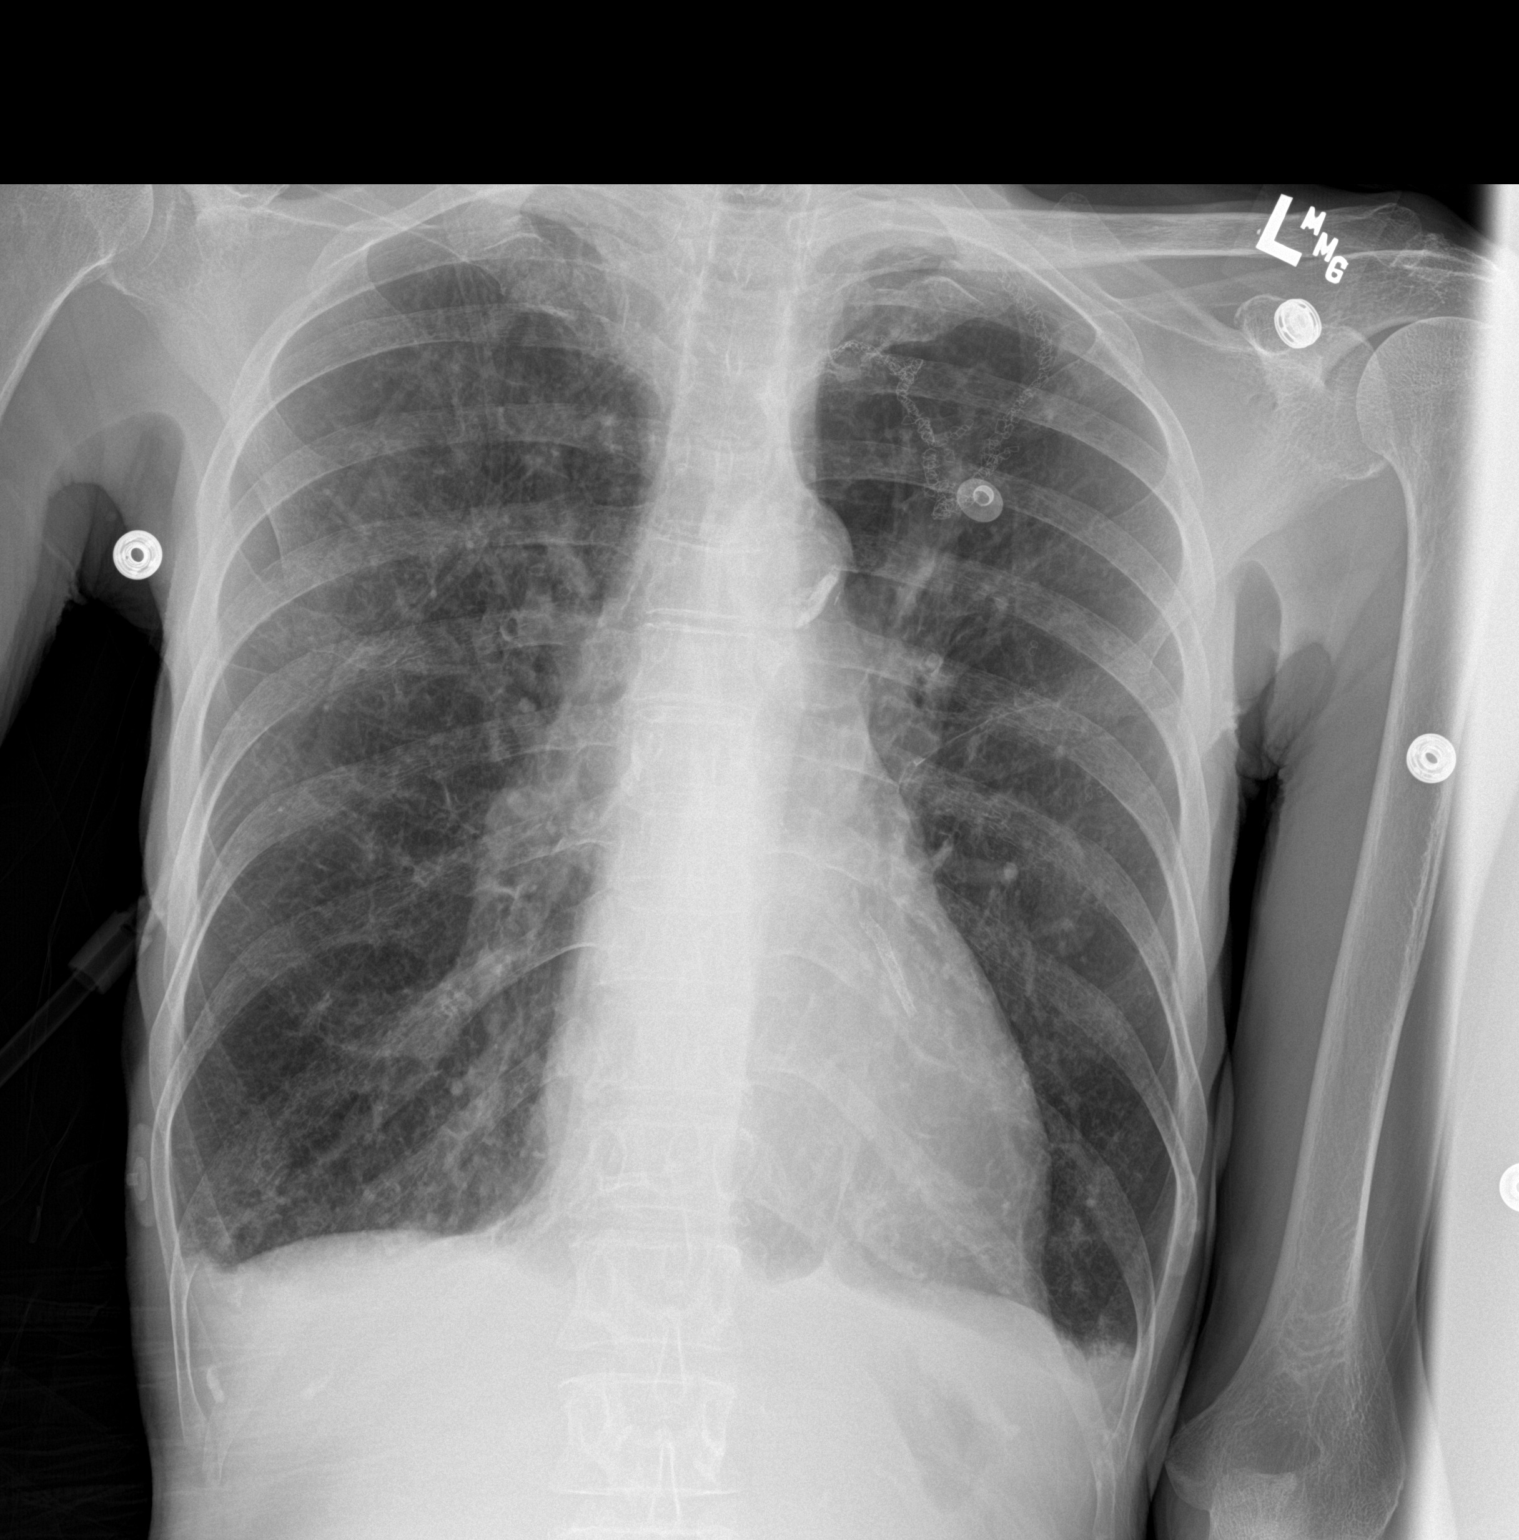

[1 of 1 positions shown; findings below may reference images not displayed]

FINDINGS: Hyperinflated lungs with emphysematous disease. Postsurgical changes
in the left lung apex. No pleural effusion or acute consolidation.
Stable cardiomediastinal silhouette with aortic atherosclerosis. No
pneumothorax.
IMPRESSION: No active disease. Hyperinflation with emphysematous disease and
left upper postsurgical changes.

## 2021-01-01 IMAGING — CT CT RENAL STONE PROTOCOL
2 of 4 series · 16 of 46 positions shown, 18 images · non-contrast
Comparison: Bilateral renal ultrasound 03/26/2018

CLINICAL DATA: Hydronephrosis.

EXAM:
CT ABDOMEN AND PELVIS WITHOUT CONTRAST
TECHNIQUE: Multidetector CT imaging of the abdomen and pelvis was performed
following the standard protocol without IV contrast.

[Series 3: stone study 5.0 i30f 2 · axial · 0.58mm/px · z∈[+609,+909]mm · 13 of 69 slices shown, 15 images]
[im 6/69  soft-tissue]
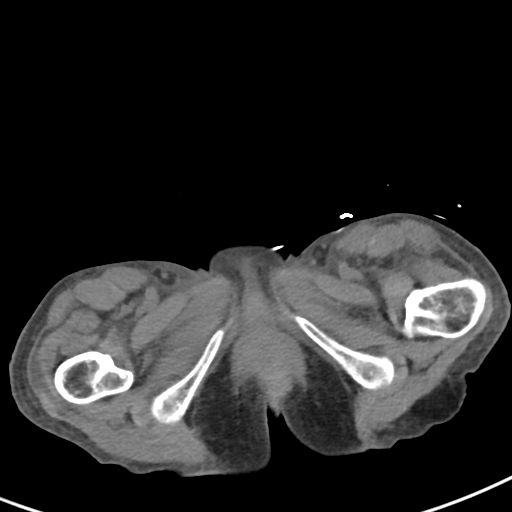
[im 6/69  bone]
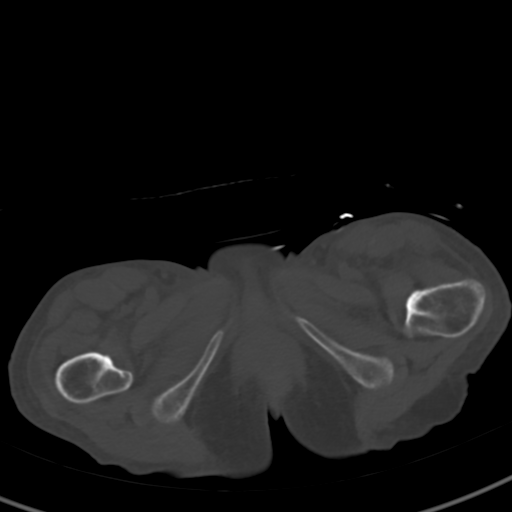
[im 11/69  soft-tissue]
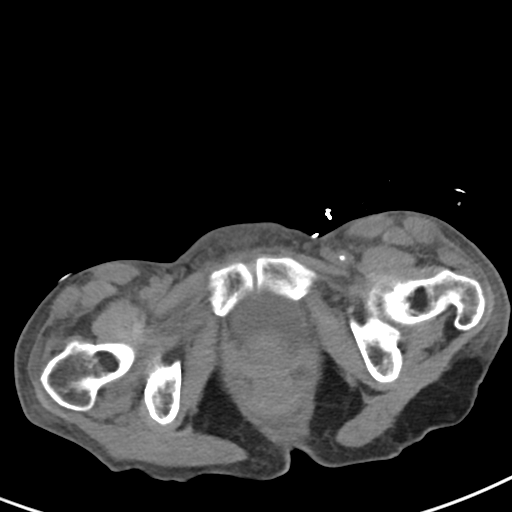
[im 16/69  soft-tissue]
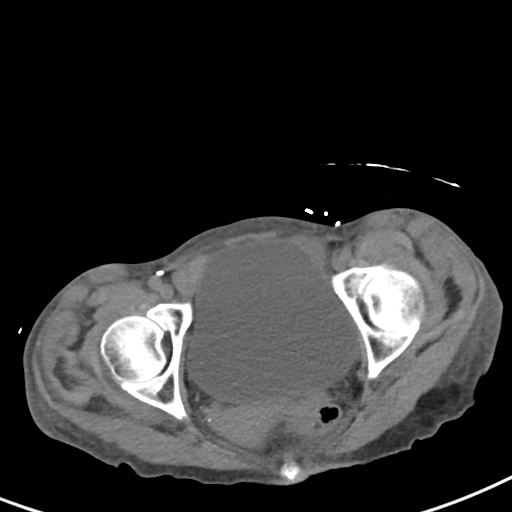
[im 21/69  soft-tissue]
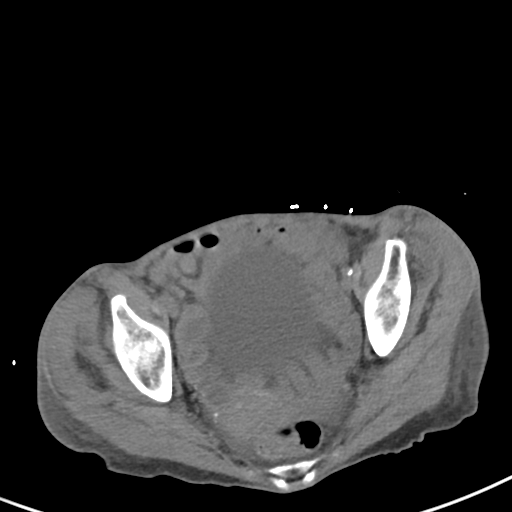
[im 26/69  soft-tissue]
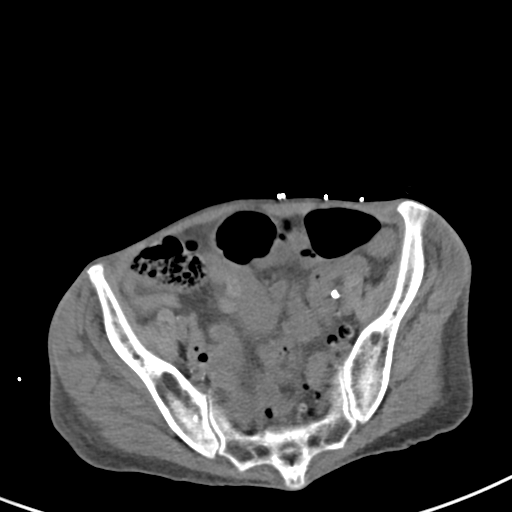
[im 31/69  soft-tissue]
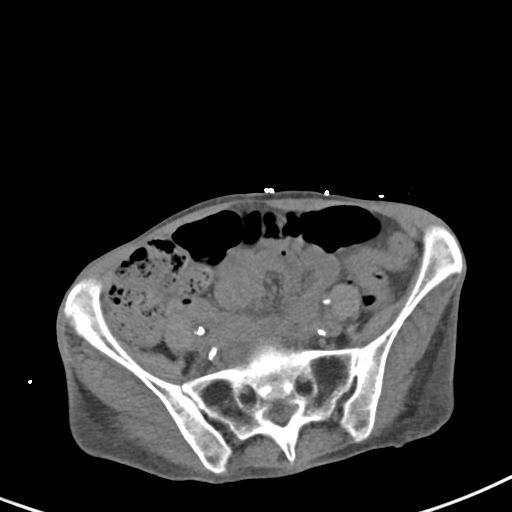
[im 36/69  soft-tissue]
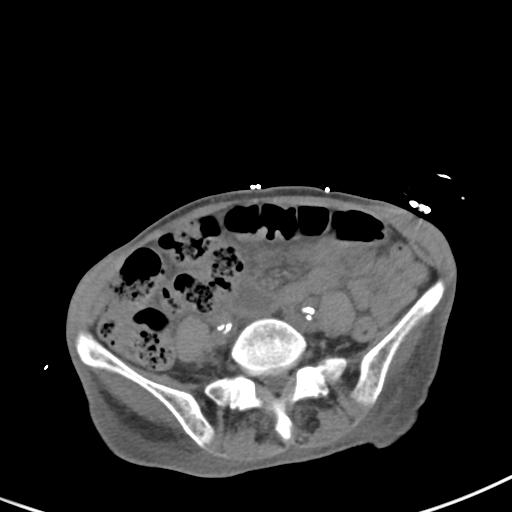
[im 41/69  soft-tissue]
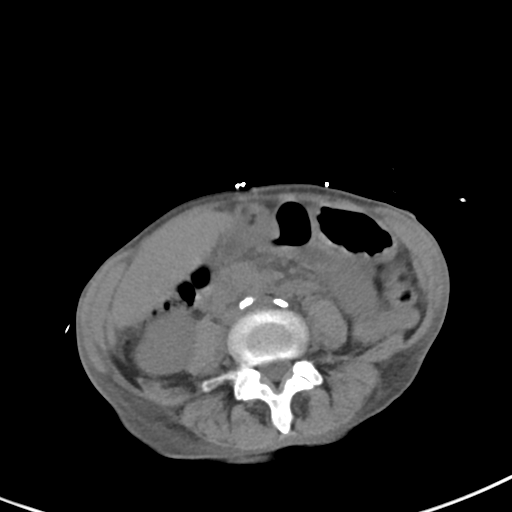
[im 46/69  soft-tissue]
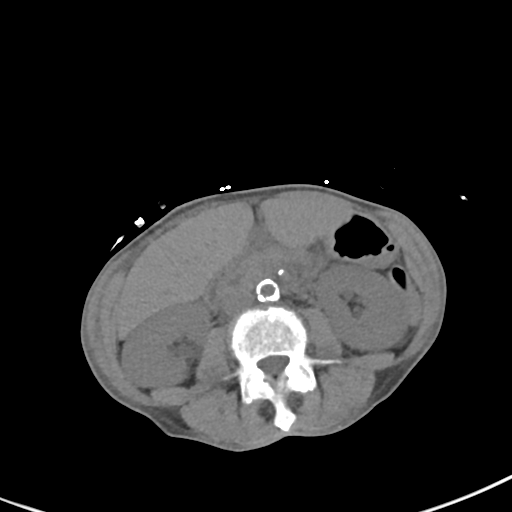
[im 46/69  bone]
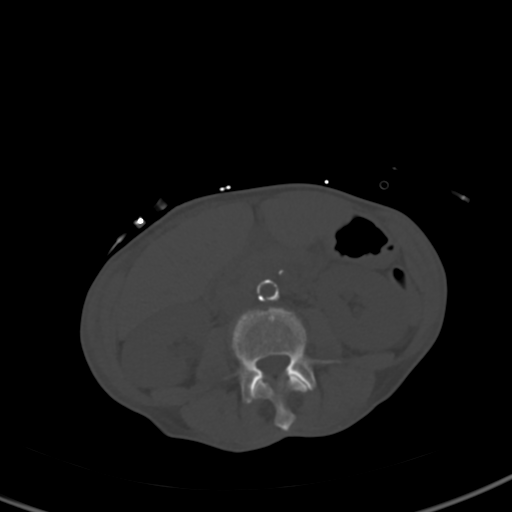
[im 51/69  soft-tissue]
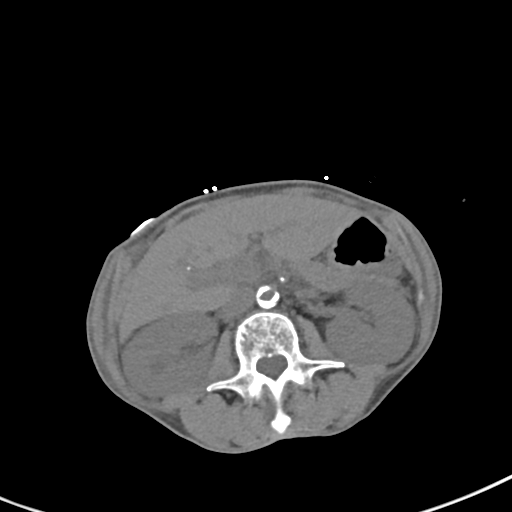
[im 56/69  soft-tissue]
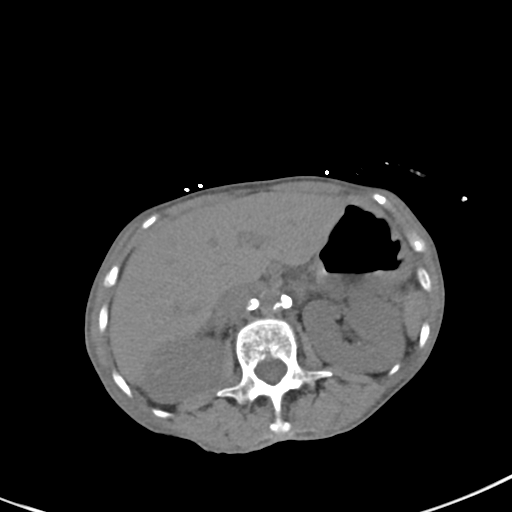
[im 61/69  soft-tissue]
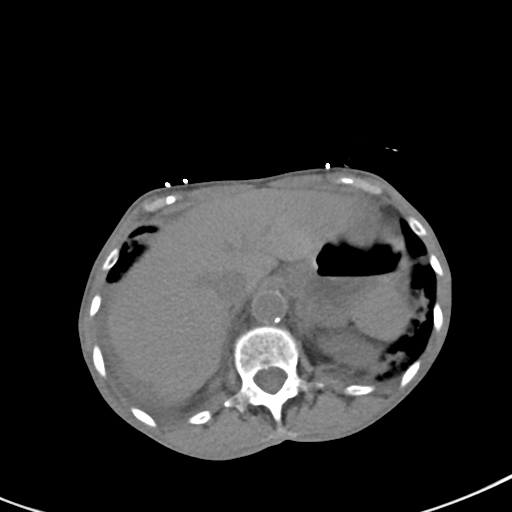
[im 66/69  soft-tissue]
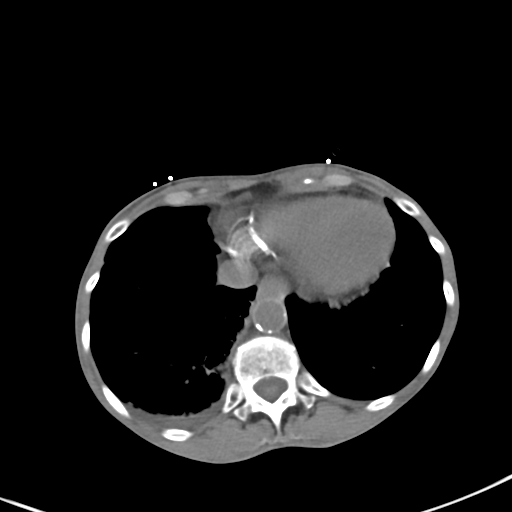

[Series 6: coronal soft tissue · coronal · 0.53mm/px · 3 of 60 slices shown]
[im 20/60  soft-tissue]
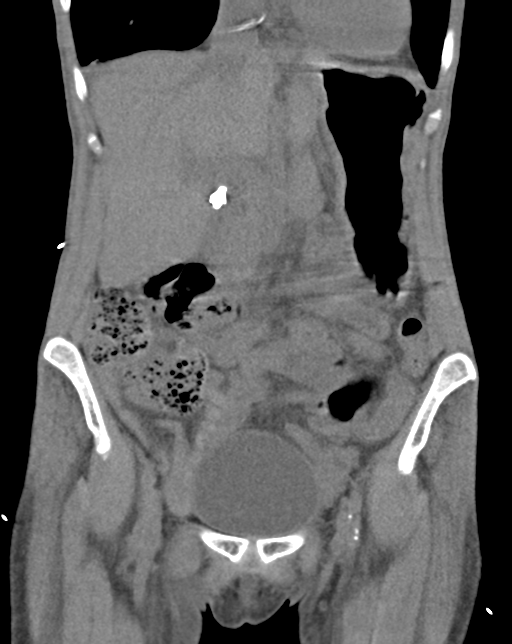
[im 27/60  soft-tissue]
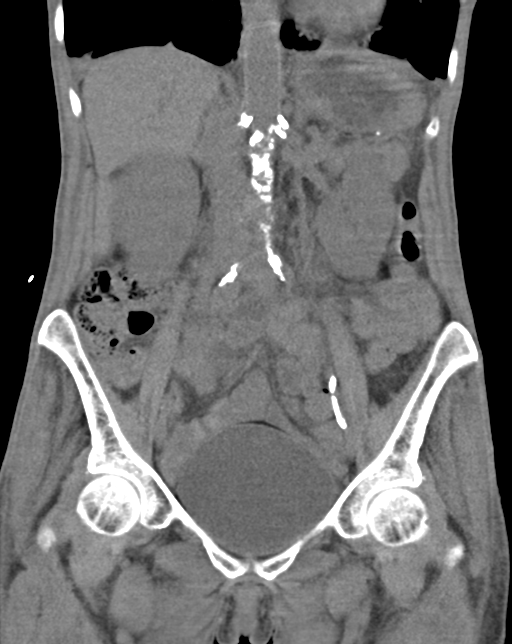
[im 33/60  soft-tissue]
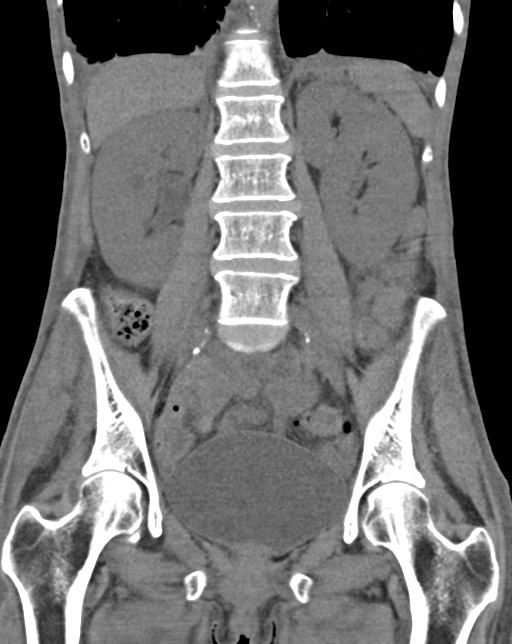

[16 of 46 positions shown; findings below may reference images not displayed]

FINDINGS: Lower chest: A small right pleural effusion is present. There is
associated atelectasis. Centrilobular emphysema is evident. There is
minimal atelectasis at the left base. The heart is enlarged.
Coronary artery calcifications are noted.

Hepatobiliary: Patient is status post cholecystectomy. Liver is
otherwise unremarkable. Common bile duct is within normal limits.

Pancreas: Unremarkable. No pancreatic ductal dilatation or
surrounding inflammatory changes.

Spleen: The spleen is hypoplastic.  No lesions are present.

Adrenals/Urinary Tract: Adrenal glands are normal bilaterally.
Punctate nonobstructing stone is present at the upper pole of the
left kidney. Mild right-sided hydronephrosis is present. There is no
left-sided hydronephrosis. No obstructing lesion is present. The
ureter is not dilated. The urinary bladder is within normal limits.

Stomach/Bowel: The stomach and duodenum are within normal limits.
Small bowel is unremarkable. The ascending and transverse colon are
normal. Descending and sigmoid colon are within normal limits.

Vascular/Lymphatic: Extensive atherosclerotic changes are present in
the aorta and branch vessels without aneurysm. No significant
adenopathy is present.

Reproductive: Uterus and adnexa are within normal limits.

Other: A small amount of free fluid is present dependently within
the anatomic pelvis.

Musculoskeletal: The vertebral body heights and alignment are
maintained. No focal lytic or blastic lesions are present. Pelvis is
normal. Hips are located and within normal limits.
IMPRESSION: 1. Mild right-sided hydronephrosis. No obstructing lesion present.
Right-sided stones.
2. No left-sided hydronephrosis.
3. Punctate nonobstructing stone at the upper pole of the left
kidney.
4.  Aortic Atherosclerosis ([id number]).
5. Cardiomegaly.
6. Coronary artery disease.
7. Small right pleural effusion and associated atelectasis.
8.  Emphysema ([id number]).
